# Patient Record
Sex: Female | Born: 1965
Health system: Southern US, Community
[De-identification: ages and names within clinical notes are randomized; demographics above are authoritative.]

## PROBLEM LIST (undated history)

## (undated) DIAGNOSIS — F419 Anxiety disorder, unspecified: Secondary | ICD-10-CM

## (undated) DIAGNOSIS — D649 Anemia, unspecified: Secondary | ICD-10-CM

## (undated) DIAGNOSIS — L905 Scar conditions and fibrosis of skin: Secondary | ICD-10-CM

## (undated) DIAGNOSIS — I10 Essential (primary) hypertension: Secondary | ICD-10-CM

## (undated) DIAGNOSIS — R002 Palpitations: Secondary | ICD-10-CM

## (undated) DIAGNOSIS — K219 Gastro-esophageal reflux disease without esophagitis: Secondary | ICD-10-CM

## (undated) DIAGNOSIS — K602 Anal fissure, unspecified: Secondary | ICD-10-CM

## (undated) DIAGNOSIS — R609 Edema, unspecified: Secondary | ICD-10-CM

## (undated) DIAGNOSIS — F329 Major depressive disorder, single episode, unspecified: Secondary | ICD-10-CM

## (undated) DIAGNOSIS — F32A Depression, unspecified: Secondary | ICD-10-CM

## (undated) DIAGNOSIS — Z9189 Other specified personal risk factors, not elsewhere classified: Secondary | ICD-10-CM

## (undated) DIAGNOSIS — K279 Peptic ulcer, site unspecified, unspecified as acute or chronic, without hemorrhage or perforation: Secondary | ICD-10-CM

## (undated) HISTORY — DX: Essential (primary) hypertension: I10

## (undated) HISTORY — PX: PARTIAL HYSTERECTOMY: SHX80

## (undated) HISTORY — DX: Other specified personal risk factors, not elsewhere classified: Z91.89

## (undated) HISTORY — PX: TUBAL LIGATION: SHX77

## (undated) HISTORY — DX: Palpitations: R00.2

## (undated) HISTORY — DX: Scar conditions and fibrosis of skin: L90.5

## (undated) HISTORY — DX: Anemia, unspecified: D64.9

## (undated) HISTORY — PX: KNEE ARTHROSCOPY: SUR90

## (undated) HISTORY — DX: Anal fissure, unspecified: K60.2

## (undated) HISTORY — DX: Anxiety disorder, unspecified: F41.9

## (undated) HISTORY — PX: DILATION AND CURETTAGE OF UTERUS: SHX78

## (undated) HISTORY — DX: Peptic ulcer, site unspecified, unspecified as acute or chronic, without hemorrhage or perforation: K27.9

## (undated) HISTORY — PX: OTHER SURGICAL HISTORY: SHX169

## (undated) HISTORY — DX: Edema, unspecified: R60.9

## (undated) HISTORY — DX: Gastro-esophageal reflux disease without esophagitis: K21.9

## (undated) HISTORY — DX: Depression, unspecified: F32.A

## (undated) HISTORY — PX: CHOLECYSTECTOMY: SHX55

## (undated) HISTORY — PX: HERNIA REPAIR: SHX51

---
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
## 2003-11-13 ENCOUNTER — Inpatient Hospital Stay (HOSPITAL_COMMUNITY): Admission: AD | Admit: 2003-11-13 | Discharge: 2003-11-13 | Payer: Self-pay | Admitting: *Deleted

## 2003-12-03 ENCOUNTER — Encounter: Admission: RE | Admit: 2003-12-03 | Discharge: 2003-12-03 | Payer: Self-pay | Admitting: Obstetrics and Gynecology

## 2003-12-03 ENCOUNTER — Encounter (INDEPENDENT_AMBULATORY_CARE_PROVIDER_SITE_OTHER): Payer: Self-pay | Admitting: Specialist

## 2003-12-03 ENCOUNTER — Other Ambulatory Visit: Admission: RE | Admit: 2003-12-03 | Discharge: 2003-12-03 | Payer: Self-pay | Admitting: Family Medicine

## 2003-12-17 ENCOUNTER — Encounter: Admission: RE | Admit: 2003-12-17 | Discharge: 2003-12-17 | Payer: Self-pay | Admitting: Obstetrics and Gynecology

## 2004-04-14 ENCOUNTER — Encounter: Admission: RE | Admit: 2004-04-14 | Discharge: 2004-04-14 | Payer: Self-pay | Admitting: Obstetrics and Gynecology

## 2004-04-28 ENCOUNTER — Encounter: Admission: RE | Admit: 2004-04-28 | Discharge: 2004-04-28 | Payer: Self-pay | Admitting: Obstetrics and Gynecology

## 2004-04-28 ENCOUNTER — Other Ambulatory Visit: Admission: RE | Admit: 2004-04-28 | Discharge: 2004-04-28 | Payer: Self-pay | Admitting: Family Medicine

## 2004-04-29 ENCOUNTER — Encounter (INDEPENDENT_AMBULATORY_CARE_PROVIDER_SITE_OTHER): Payer: Self-pay | Admitting: *Deleted

## 2004-05-12 ENCOUNTER — Ambulatory Visit: Payer: Self-pay | Admitting: Family Medicine

## 2004-08-11 ENCOUNTER — Ambulatory Visit: Payer: Self-pay | Admitting: Family Medicine

## 2004-08-11 ENCOUNTER — Encounter (INDEPENDENT_AMBULATORY_CARE_PROVIDER_SITE_OTHER): Payer: Self-pay | Admitting: Specialist

## 2004-08-11 ENCOUNTER — Other Ambulatory Visit: Admission: RE | Admit: 2004-08-11 | Discharge: 2004-08-11 | Payer: Self-pay | Admitting: Family Medicine

## 2004-08-25 ENCOUNTER — Ambulatory Visit: Payer: Self-pay | Admitting: Obstetrics and Gynecology

## 2004-09-28 ENCOUNTER — Ambulatory Visit: Payer: Self-pay | Admitting: Internal Medicine

## 2004-10-04 ENCOUNTER — Ambulatory Visit: Payer: Self-pay | Admitting: Internal Medicine

## 2004-10-19 ENCOUNTER — Ambulatory Visit (HOSPITAL_COMMUNITY): Admission: RE | Admit: 2004-10-19 | Discharge: 2004-10-19 | Payer: Self-pay | Admitting: Internal Medicine

## 2004-10-19 ENCOUNTER — Ambulatory Visit: Payer: Self-pay | Admitting: Internal Medicine

## 2004-12-01 ENCOUNTER — Ambulatory Visit: Payer: Self-pay | Admitting: Family Medicine

## 2005-01-06 ENCOUNTER — Ambulatory Visit: Payer: Self-pay | Admitting: Internal Medicine

## 2005-06-19 ENCOUNTER — Ambulatory Visit: Payer: Self-pay | Admitting: Internal Medicine

## 2005-09-25 IMAGING — CR DG CHEST 2V
2 series · 2 of 2 positions shown · non-contrast
Comparison: None.

CLINICAL DATA: Cough/fever.  
 CHEST - TWO VIEW:

[view not recorded (1 of 2)]
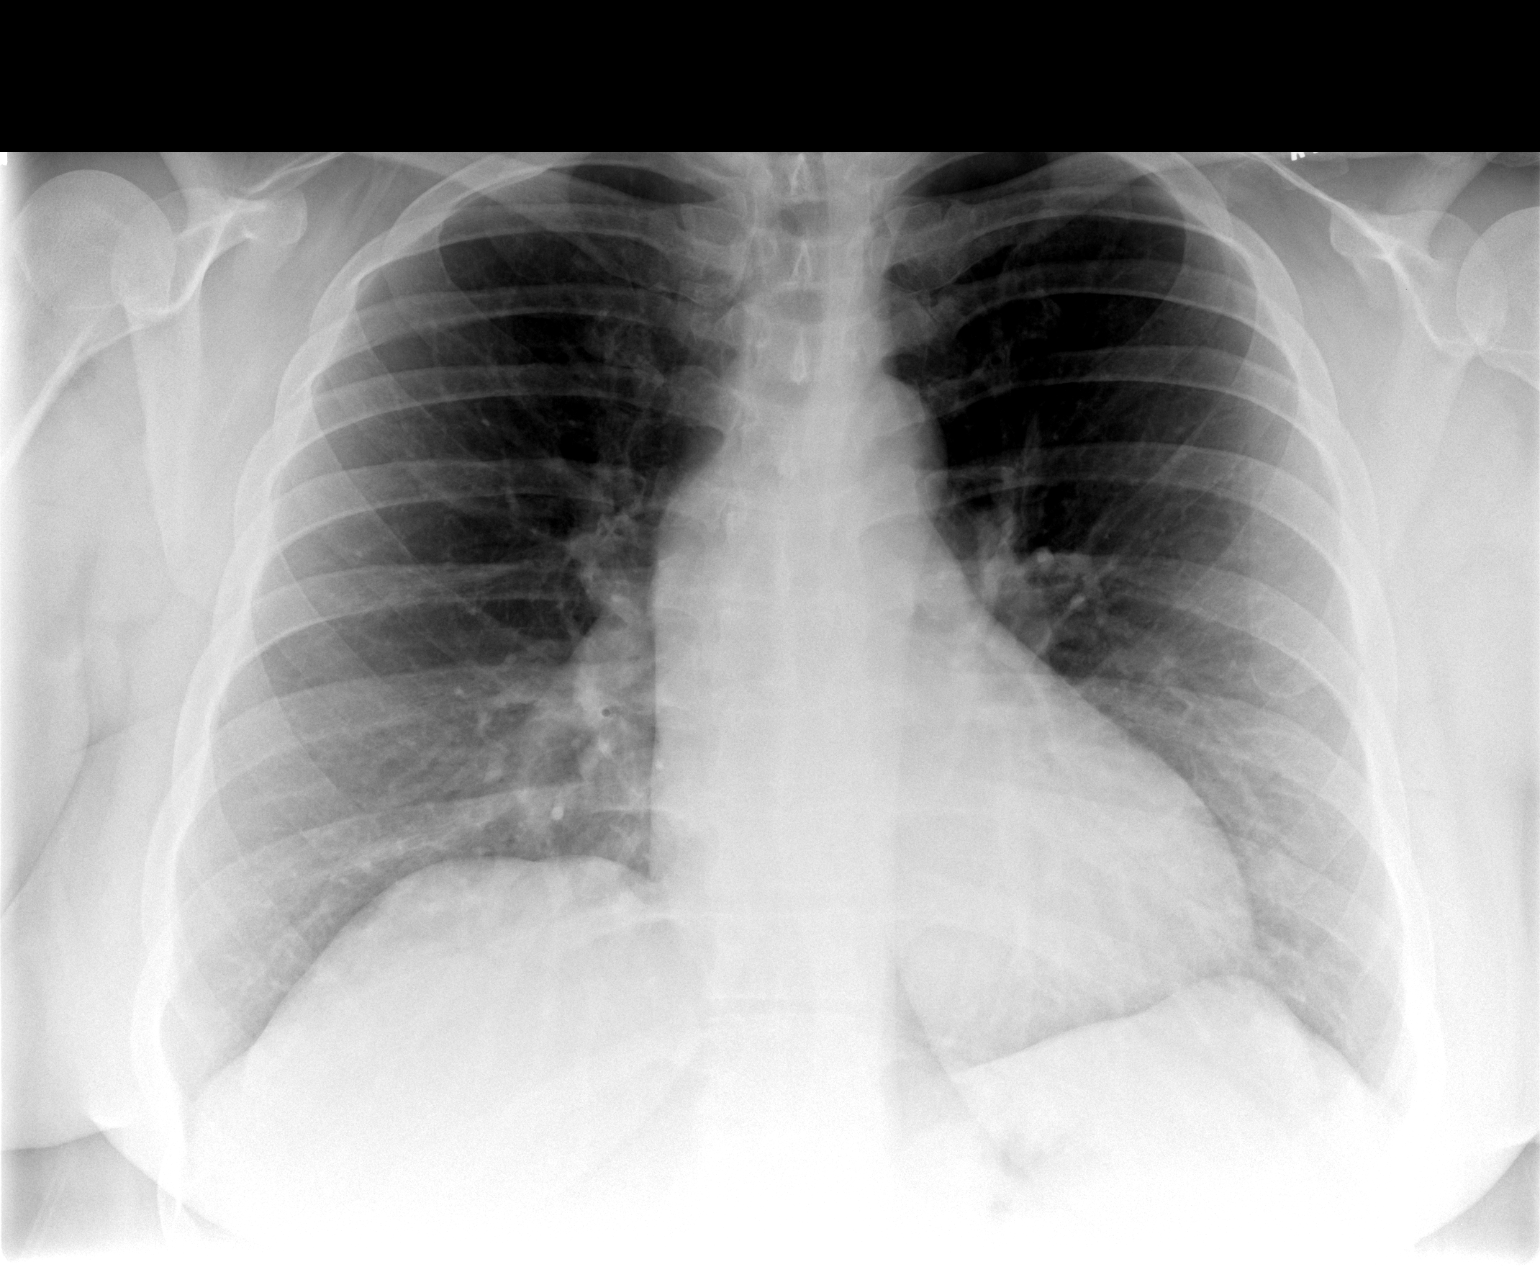

[view not recorded (2 of 2)]
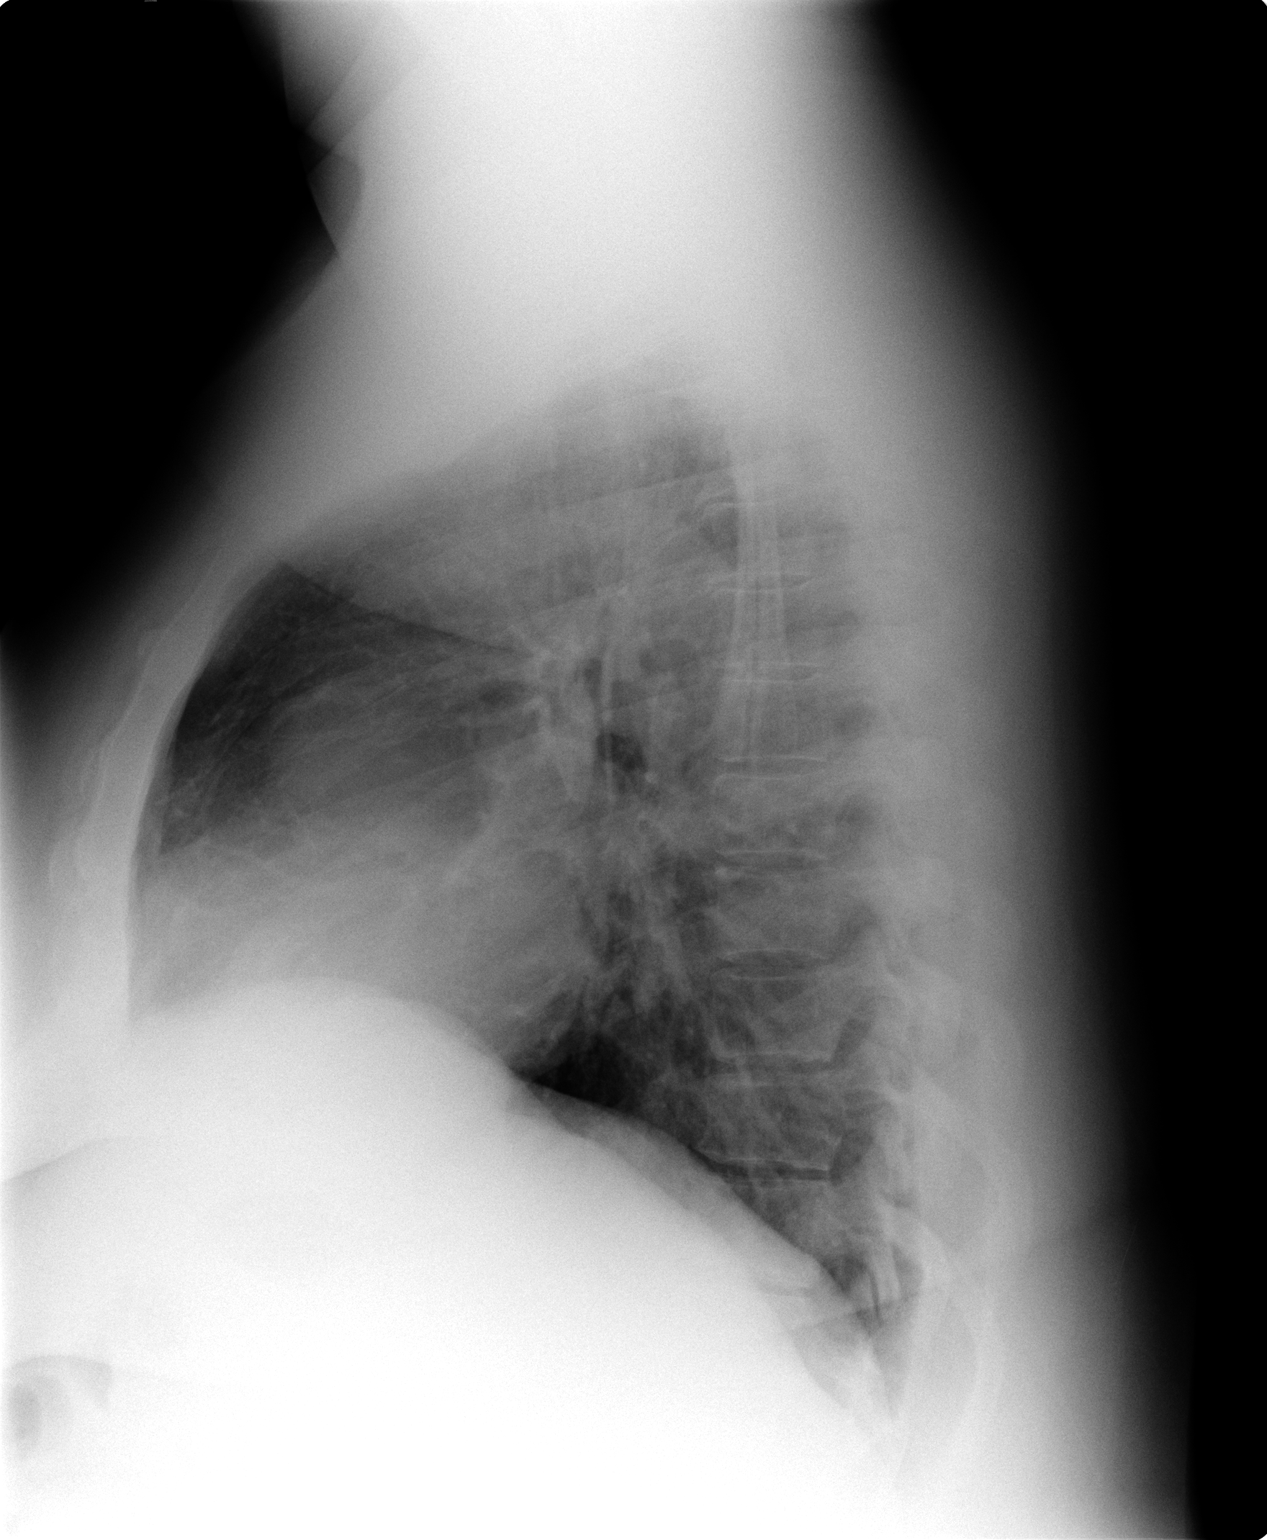

[2 of 2 positions shown; findings below may reference images not displayed]

FINDINGS: Heart mildly enlarged.  Aorta slightly tortuous.  No congestive heart failure or active disease.  Osseous structures intact.
IMPRESSION: Slight cardiomegaly - no active disease.

## 2005-12-07 ENCOUNTER — Encounter: Payer: Self-pay | Admitting: Family Medicine

## 2005-12-07 ENCOUNTER — Encounter (INDEPENDENT_AMBULATORY_CARE_PROVIDER_SITE_OTHER): Payer: Self-pay | Admitting: Internal Medicine

## 2005-12-07 ENCOUNTER — Other Ambulatory Visit: Admission: RE | Admit: 2005-12-07 | Discharge: 2005-12-07 | Payer: Self-pay | Admitting: Family Medicine

## 2005-12-07 ENCOUNTER — Ambulatory Visit: Payer: Self-pay | Admitting: Family Medicine

## 2005-12-11 ENCOUNTER — Encounter (INDEPENDENT_AMBULATORY_CARE_PROVIDER_SITE_OTHER): Payer: Self-pay | Admitting: *Deleted

## 2005-12-11 ENCOUNTER — Encounter (INDEPENDENT_AMBULATORY_CARE_PROVIDER_SITE_OTHER): Payer: Self-pay | Admitting: Dermatology

## 2005-12-14 ENCOUNTER — Encounter (INDEPENDENT_AMBULATORY_CARE_PROVIDER_SITE_OTHER): Payer: Self-pay | Admitting: Internal Medicine

## 2005-12-14 ENCOUNTER — Ambulatory Visit (HOSPITAL_COMMUNITY): Admission: RE | Admit: 2005-12-14 | Discharge: 2005-12-14 | Payer: Self-pay | Admitting: *Deleted

## 2005-12-21 ENCOUNTER — Ambulatory Visit: Payer: Self-pay | Admitting: Family Medicine

## 2006-04-27 ENCOUNTER — Ambulatory Visit: Payer: Self-pay | Admitting: Internal Medicine

## 2006-05-16 ENCOUNTER — Ambulatory Visit: Payer: Self-pay | Admitting: Internal Medicine

## 2006-06-07 ENCOUNTER — Encounter (INDEPENDENT_AMBULATORY_CARE_PROVIDER_SITE_OTHER): Payer: Self-pay | Admitting: Dermatology

## 2006-06-07 DIAGNOSIS — I10 Essential (primary) hypertension: Secondary | ICD-10-CM | POA: Insufficient documentation

## 2006-06-07 DIAGNOSIS — E669 Obesity, unspecified: Secondary | ICD-10-CM | POA: Insufficient documentation

## 2006-06-07 DIAGNOSIS — N921 Excessive and frequent menstruation with irregular cycle: Secondary | ICD-10-CM | POA: Insufficient documentation

## 2006-06-07 DIAGNOSIS — M7710 Lateral epicondylitis, unspecified elbow: Secondary | ICD-10-CM | POA: Insufficient documentation

## 2006-09-05 DIAGNOSIS — D509 Iron deficiency anemia, unspecified: Secondary | ICD-10-CM

## 2006-10-25 ENCOUNTER — Encounter (INDEPENDENT_AMBULATORY_CARE_PROVIDER_SITE_OTHER): Payer: Self-pay | Admitting: *Deleted

## 2006-10-25 ENCOUNTER — Telehealth: Payer: Self-pay | Admitting: *Deleted

## 2006-10-25 ENCOUNTER — Ambulatory Visit: Payer: Self-pay | Admitting: Internal Medicine

## 2006-10-25 DIAGNOSIS — A088 Other specified intestinal infections: Secondary | ICD-10-CM

## 2006-10-25 DIAGNOSIS — J069 Acute upper respiratory infection, unspecified: Secondary | ICD-10-CM | POA: Insufficient documentation

## 2006-10-25 LAB — CONVERTED CEMR LAB
Inflenza A Ag: NEGATIVE
Influenza B Ag: NEGATIVE
Streptococcus, Group A Screen (Direct): NEGATIVE

## 2006-10-26 LAB — CONVERTED CEMR LAB
Basophils Absolute: 0 10*3/uL (ref 0.0–0.1)
Basophils Relative: 0 % (ref 0–1)
Eosinophils Absolute: 0.1 10*3/uL (ref 0.0–0.7)
MCHC: 32.3 g/dL (ref 30.0–36.0)
MCV: 66.8 fL — ABNORMAL LOW (ref 78.0–100.0)
Monocytes Relative: 6 % (ref 3–11)
Neutro Abs: 5.8 10*3/uL (ref 1.7–7.7)
Neutrophils Relative %: 69 % (ref 43–77)
Platelets: 329 10*3/uL (ref 150–400)
RBC: 4.41 M/uL (ref 3.87–5.11)

## 2007-09-19 ENCOUNTER — Ambulatory Visit: Payer: Self-pay | Admitting: Family Medicine

## 2007-09-19 ENCOUNTER — Other Ambulatory Visit: Admission: RE | Admit: 2007-09-19 | Discharge: 2007-09-19 | Payer: Self-pay | Admitting: Family Medicine

## 2007-09-19 ENCOUNTER — Encounter: Payer: Self-pay | Admitting: Family Medicine

## 2007-10-09 ENCOUNTER — Telehealth: Payer: Self-pay | Admitting: *Deleted

## 2007-10-14 ENCOUNTER — Ambulatory Visit: Payer: Self-pay | Admitting: Internal Medicine

## 2007-10-14 ENCOUNTER — Encounter (INDEPENDENT_AMBULATORY_CARE_PROVIDER_SITE_OTHER): Payer: Self-pay | Admitting: Infectious Diseases

## 2007-10-16 LAB — CONVERTED CEMR LAB
BUN: 9 mg/dL (ref 6–23)
Chloride: 104 meq/L (ref 96–112)
Hemoglobin: 8.3 g/dL — ABNORMAL LOW (ref 12.0–15.0)
MCHC: 27.4 g/dL — ABNORMAL LOW (ref 30.0–36.0)
Platelets: 380 10*3/uL (ref 150–400)
Potassium: 4 meq/L (ref 3.5–5.3)
RDW: 21.9 % — ABNORMAL HIGH (ref 11.5–15.5)
Sodium: 140 meq/L (ref 135–145)

## 2007-10-18 ENCOUNTER — Ambulatory Visit: Payer: Self-pay | Admitting: Family Medicine

## 2007-10-30 ENCOUNTER — Ambulatory Visit: Payer: Self-pay | Admitting: Family Medicine

## 2010-09-25 ENCOUNTER — Encounter: Payer: Self-pay | Admitting: *Deleted

## 2011-01-17 NOTE — Group Therapy Note (Signed)
[name], [name] NO.:  [id number]   MEDICAL RECORD NO.:  [id number]          PATIENT TYPE:  WOC   LOCATION[facility]                   FACILITY:  [facility]   PHYSICIAN:  Tinnie Gens, MD        DATE OF BIRTH:  05-18-1966   DATE OF SERVICE:  09/19/2007                                  CLINIC NOTE   CHIEF COMPLAINT:  Heavy vaginal bleeding.   HISTORY OF PRESENT ILLNESS:  The patient is a 45 year old gravida 2,  para 2-0-0-3 who has a history of simple hyperplasia.  She has  previously had endometrial biopsy and been on progesterone. However, the  progesterone seems to make her have increased appetite and she does not  want to take that again if possible and her cycles have been regular up  until last two or three so now her heavy bleeding is again and is back  for repeat endometrial biopsy.  Her last Pap was approximately 2 years  ago as well.   PAST MEDICAL HISTORY:  Hypertension.   MEDICATIONS:  She is on lisinopril/hypothyroidism 20/25 one p.o. daily,  multivitamin daily.   ALLERGIES:  ARE TO SULFA, PSEUDOEPHEDRINE and GUAIFENESIN.   PAST SURGICAL HISTORY:  1. Cholecystectomy.  2. BTL and repair of clubbed feet.   GYN HISTORY:  Menarche at age 17.  Cycles are heavy, every two weeks.  No history of abnormal Pap. No mammogram in the last year.   FAMILY HISTORY:  Diabetes, hypertension, skin cancer.   SOCIAL HISTORY:  Married with three sons, works and has just moved to  __________.  She has health insurance now.   REVIEW OF SYSTEMS:  Fourteen point review of system review is negative  for fever, chills, nausea, vomiting, diarrhea, constipation or shortness  of breath, chest pain, abdominal pain, constipation, diarrhea or  dysuria.   PHYSICAL EXAMINATION:  Blood pressure is 136/79, weight is 328 pounds,  pulse 83, temperature 99.1, respiratory rate is 20. She is an obese  female in no acute distress.  ABDOMEN:  Soft, nontender, nondistended.  GU:   Normal external female genitalia.  BUS is normal.  Vagina is pink  and rugated.  Cervix is parous without lesion.  Uterus and adnexa cannot  be appropriately felt secondary to body habitus.   PROCEDURE:  Cervix is identified, grasped anteriorly with single toothed  tenaculum and sounded to approximately 10 cm; endometrial sampling is  taken; the Pipelle was passed x3.  The patient tolerated procedure well.   IMPRESSION:  1. GYN exam with Pap.  2. Heavy vaginal bleeding with a history of simple hyperplasia.  3. Obesity.   PLAN:  1. Pap smear today.  2. Schedule mammogram at her convenience.  3. Endometrial sampling.  Return in week for results.           ______________________________  Tinnie Gens, MD     TP/MEDQ  D:  09/19/2007  T:  09/19/2007  Job:  [id number]

## 2011-01-17 NOTE — Assessment & Plan Note (Signed)
NAMEARLINA, Sherry Middleton NO.:  [id number]   MEDICAL RECORD NO.:  [id number]          PATIENT TYPE:  WOC   LOCATION:  [facility]         FACILITY:  [facility]   PHYSICIAN:  Tinnie Gens, MD        DATE OF BIRTH:  Feb 23, 1966   DATE OF SERVICE:                                  CLINIC NOTE   CHIEF COMPLAINT:  Follow-up bleeding.   HISTORY OF PRESENT ILLNESS:  The patient is a 45 year old gravida 2,  para 2 who has a history of simple endometrial hyperplasia secondary to  abnormal bleeding and had endometrial sampling done back in January.  Since that time, she has had heavy bleeding continued.  Her biopsy  reveals again simple hyperplasia without evidence of atypia.  Again  discussion was had about oral progestin versus IUD.  The patient  declines IUD at this time.  We will try cyclical oral progestin 10 days  a month and repeat endometrial sampling in 3 months.           ______________________________  Tinnie Gens, MD     TP/MEDQ  D:  10/18/2007  T:  10/20/2007  Job:  [id number]

## 2011-01-20 NOTE — Group Therapy Note (Signed)
Sherry Middleton, ROYSE NO.:  [id number]   MEDICAL RECORD NO.:  [id number]          PATIENT TYPE:  WOC   [facility]ATION[facility]                   FACILITY:  [facility]   PHYSICIAN:  Tinnie Gens, MD        DATE OF BIRTH:  26-Oct-1965   DATE OF SERVICE:  12/21/2005                                    CLINIC NOTE   CHIEF COMPLAINT:  Polyp results.   HISTORY OF PRESENT ILLNESS:  The patient is a 45 year old patient who has  history of simple hyperplasia who has heavy vaginal bleeding over the last  year exam and had endometrial biopsy at that time.  All her labs are  reviewed today.  She has a normal mammogram, normal Pap smear and a normal  endometrial biopsy.  She reports that her bleeding, although more frequent,  is not any heavier than it had previously been with a negative biopsy.  Will  defer treatment at this time.  She will follow up in six months to see how  she is doing.           ______________________________  Tinnie Gens, MD     TP/MEDQ  D:  12/21/2005  T:  12/22/2005  Job:  [id number]

## 2011-01-20 NOTE — Group Therapy Note (Signed)
Sherry Middleton, RUNKEL NO.:  [id number]   MEDICAL RECORD NO.:  [id number]          PATIENT TYPE:  WOC   [facility]ATION[facility]                   FACILITY:  [facility]   PHYSICIAN:  Tinnie Gens, MD        DATE OF BIRTH:  11-22-1965   DATE OF SERVICE:  08/11/2004                                    CLINIC NOTE   CHIEF COMPLAINT:  Follow up endometrial hyperplasia.   HISTORY OF PRESENT ILLNESS:  The patient is a 45 year old gravida 2 para 2-0-  0-3 who has a history of simple hyperplasia who has been on Provera for the  last 6 months.  She has undergone endometrial biopsy once and her  hyperplasia got on to simple.  Her last two periods have been very light  which is unusual for her, and we are hoping that her endometrial hyperplasia  has gone away completely.  The patient has gained about 15 pounds since she  has been on progesterone and states that she is always very hungry.   PHYSICAL EXAMINATION TODAY:  GENERAL:  She is an obese female in no acute  distress.  GENITOURINARY:  She has some raised, red areas on her labia that are  consistent with chronic yeast.  She had normal external female genitalia  other than that.  The vagina is rugated and pink.  The cervix is visualized  without lesions.   PROCEDURE:  Cleaned with Betadine x3 and Pipelle is used which sounds to  approximately 12 cm and an endometrial biopsy is obtained.  The patient has  tolerated the procedure well.  The tenaculum was removed and the speculum  removed from the vagina.   IMPRESSION:  History of endometrial hyperplasia.   PLAN:  The patient will continue Provera for the next 2 weeks until results  come back from this biopsy.  If this biopsy is normal, she can go on  intermittent Provera every 4-6 weeks and does not have to be on it  continuously.  If it continues to be positive, will discuss other options  for her.      TP/MEDQ  D:  08/11/2004  T:  08/12/2004  Job:  [id number]

## 2011-01-20 NOTE — Group Therapy Note (Signed)
NAMEFAREEHA, EVON NO.:  [id number]   MEDICAL RECORD NO.:  [id number]                   PATIENT TYPE:  WOC   LOCATION[facility]  [facility]                           FACILITY:  [facility]   PHYSICIAN:  Tinnie Gens, MD                     DATE OF BIRTH:  04-16-1966   DATE OF SERVICE:  05/12/2004                                    CLINIC NOTE   CHIEF COMPLAINT:  Follow-up endometrial biopsy for endometrial hyperplasia.   SUBJECTIVE:  Ms. Westergard is here today for follow-up of her results.  The good news is that her biopsy now shows only focal simple hyperplasia  without atypia.  Her initial biopsy showed focal simple and complex  hyperplasia without atypia.  She has been on Provera for this month.  She  has just started back on it at her last visit with Dr. Shawnie Pons at the end of  August.  She has had some spotting on the Provera but otherwise has not  bled.  She is having no pelvic pain.   OBJECTIVE:  VITAL SIGNS:  Noted.  GENERAL:  She is an overweight female in no acute distress.   ASSESSMENT AND PLAN:  1.  Endometrial hyperplasia.  She will continue with her Provera for the      next 3 months.  At that time she will see Dr. Shawnie Pons for repeat biopsy.      We did discuss continuing Provera versus oral contraceptives if her      blood pressure can be controlled.  2.  Hypertension.  Her blood pressure is elevated today.  She is on      lisinopril and hydrochlorothiazide and is being followed by the [facility]      outpatient internal medicine clinic.   She is to follow up in December.     Jon Gills, MD                       Tinnie Gens, MD    LC/MEDQ  D:  05/12/2004  T:  05/13/2004  Job:  [id number]

## 2011-01-20 NOTE — Group Therapy Note (Signed)
Sherry Middleton, JEFCOAT NO.:  [id number]   MEDICAL RECORD NO.:  [id number]          PATIENT TYPE:  WOC   [facility]ATION[facility]                   FACILITY:  [facility]   PHYSICIAN:  Tinnie Gens, MD        DATE OF BIRTH:  07/10/1966   DATE OF SERVICE:                                    CLINIC NOTE   CHIEF COMPLAINT:  Yearly exam.   HISTORY OF PRESENT ILLNESS:  The patient is a 45 year old gravida 2, para 2-  0-0-3 who has not been seen here for over a year.  She was previously seen  for cervical hyperplasia without atypia and she was cycled on Provera.  Her  last endometrial biopsy was negative in December 2005.  She has not been  using Provera since then but does have some irregular bleeding.  No other  complaints GYN-related.  The patient denies passage of clots.   PAST MEDICAL HISTORY:  Significant for hypertension.   MEDICATIONS:  Zestoretic.   ALLERGIES:  SULFA AND PSEUDOEPHEDRINE.   PAST SURGICAL HISTORY:  1.  Cholecystectomy.  2.  Tubal ligation.  3.  Repair of clubfeet.   GYN HISTORY:  Menarche at age 36.  Cycles are heavy.  They come every two  weeks.  No history of abnormal Paps.  She has not had a mammogram yet.   FAMILY HISTORY:  Diabetes, hypertension and skin cancer.   SOCIAL HISTORY:  She is married.  She has three sons.  She works.  She has a  new addition to her family in the form of a new grandson.   REVIEW OF SYSTEMS:  A 14-point review of systems was reviewed and is  negative for fevers, chills, nausea, vomiting, diarrhea, constipation,  shortness of breath, chest pain.   EXAM:  Her VITALS are as noted in the chart.  She is afebrile at 98.6, blood  pressure is 150/77, weight 323.4.  She is a morbidly obese white female in  no acute distress.  ABDOMEN:  Soft, nontender, nondistended.  BREASTS:  Symmetric with everted nipples.  She has diffuse bilateral  fibrocystic changes.  No supraclavicular or axillary adenopathy.  GU:  Normal external  female genitalia.  Vagina is pink and rugae of the  cervix is parous and without lesions.   PROCEDURE:  The cervix was cleaned with Betadine x 3.  It sounded to  approximately 10 cm.  Anterior lip was grasped with a single-tooth  tenaculum.  An endometrial biopsy sampling is obtained.  On bimanual, the  uterus cannot really be palpated nor can the adnexa due to body habitus.   IMPRESSION:  1.  Abnormal uterine bleeding.  2.  Yearly exam with Pap smear.   PLAN:  1.  Pap smear today.  2.  Schedule mammogram.  3.  Endometrial biopsy sampling.  Will follow up results in two weeks from      the endometrial sampling and consider a Mirena IUD placement.  The      patient is very reluctant to go back on Provera.           ______________________________  Tinnie Gens, MD  TP/MEDQ  D:  12/07/2005  T:  12/08/2005  Job:  [id number]

## 2011-01-20 NOTE — Group Therapy Note (Signed)
NAMEARTI, TRANG NO.:  [id number]   MEDICAL RECORD NO.:  [id number]          PATIENT TYPE:  WOC   [facility]ATION[facility]                   FACILITY:  [facility]   PHYSICIAN:  Tinnie Gens, MD        DATE OF BIRTH:  Apr 26, 1966   DATE OF SERVICE:                                    CLINIC NOTE   CHIEF COMPLAINT:  Three month followup.   HISTORY OF PRESENT ILLNESS:  The patient is a 45 year old para 2, 0-0-3, who  has history of simple hyperplasia in the past.  She also has history of  complex hyperplasia, who's last endometrial biopsy in December of last year  was normal.  She has been advised not to do any Provera if she does not have  a period.  Her periods have been regular and her bleeding is less, but still  sort of heavy.  She has lost 10 pounds since her last visit.   On examination, she is an obese female in no acute distress.  Abdomen soft,  nontender, nondistended.   IMPRESSION:  1.  Endometrial hyperplasia.  2.  Menorrhagia.   PLAN:  1.  The patient will continue her monitor her periods and see how they go.      If she continues to have heavy bleeding at her next visit, will repeat      her endometrial biopsy.  2.  Return in six months for Pap.  Mammogram scheduled.      TP/MEDQ  D:  12/01/2004  T:  12/01/2004  Job:  [id number]

## 2011-01-20 NOTE — Group Therapy Note (Signed)
Sherry Middleton, DARGIS NO.:  [id number]   MEDICAL RECORD NO.:  [id number]                   PATIENT TYPE:  OUT   LOCATION[facility]                           FACILITY:  [facility]   PHYSICIAN:  Tinnie Gens, MD                     DATE OF BIRTH:  1965-10-05   DATE OF SERVICE:  04/28/2004                                    CLINIC NOTE   CHIEF COMPLAINT:  Follow up hyperplasia.   HISTORY OF PRESENT ILLNESS:  The patient is a 45 year old gravida 2 para 2-0-  0-3 who had complex and simple hyperplasia on an endometrial biopsy in  April.  She has been on Provera  for the last three-and-a-half months until  she ran out.  She has been off it for approximately 2 weeks.  She continues  to have heavy periods that last at least 10 days.  She is here for a repeat  biopsy today.   PHYSICAL EXAMINATION TODAY:  VITAL SIGNS:  Her blood pressure is 192/89;  recheck of this is 120/62.  Weight is 324.3.  GENERAL:  She is an obese white female in no acute distress.  PELVIC:  External genitalia show probable chronic candidiasis.  The vagina  is rugated, cervix is without lesion.   PROCEDURE:  A single tooth tenaculum used to grasp the anterior lip of the  cervix.  It was cleaned with Betadine x3 and a Pipelle is used to sound the  uterus, which sounds to 11 cm.  Biopsy is obtained without difficulty.  The  single tooth tenaculum was removed from the cervix.   IMPRESSION:  1. Complex and simple hyperplasia without atypia.  Continue Provera.  Follow-     up will be in 3 months.  2. Hypertension.  Discussed with her her poorly-controlled blood pressure.     She reports she has taken her medicine but is about to run out of her     prescriptions.  She would like me to refill these.  Also, she reports     that she does not tolerate calcium channel blockers or beta blockers and     she is already on a combination of ACE and diuretic.   PLAN:  Have tried to refer her to the  outpatient clinic at [facility] for  blood pressure control.  She will be called when an appointment is made.                                               Tinnie Gens, MD    TP/MEDQ  D:  04/28/2004  T:  04/29/2004  Job:  [id number]
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
## 2012-06-18 DIAGNOSIS — Z599 Problem related to housing and economic circumstances, unspecified: Secondary | ICD-10-CM | POA: Insufficient documentation

## 2013-02-08 DIAGNOSIS — F411 Generalized anxiety disorder: Secondary | ICD-10-CM | POA: Insufficient documentation

## 2013-02-08 DIAGNOSIS — F339 Major depressive disorder, recurrent, unspecified: Secondary | ICD-10-CM | POA: Insufficient documentation

## 2013-12-18 ENCOUNTER — Ambulatory Visit (INDEPENDENT_AMBULATORY_CARE_PROVIDER_SITE_OTHER): Payer: BC Managed Care – PPO

## 2013-12-18 VITALS — BP 118/69 | HR 74 | Resp 18

## 2013-12-18 DIAGNOSIS — M1990 Unspecified osteoarthritis, unspecified site: Secondary | ICD-10-CM

## 2013-12-18 DIAGNOSIS — M7750 Other enthesopathy of unspecified foot: Secondary | ICD-10-CM

## 2013-12-18 DIAGNOSIS — R52 Pain, unspecified: Secondary | ICD-10-CM

## 2013-12-18 DIAGNOSIS — Q6689 Other  specified congenital deformities of feet: Secondary | ICD-10-CM

## 2013-12-18 DIAGNOSIS — Z8776 Personal history of (corrected) congenital malformations of integument, limbs and musculoskeletal system: Secondary | ICD-10-CM

## 2013-12-18 DIAGNOSIS — Z87768 Personal history of other specified (corrected) congenital malformations of integument, limbs and musculoskeletal system: Secondary | ICD-10-CM

## 2013-12-18 NOTE — Progress Notes (Signed)
   Subjective:    Patient ID: Sherry Middleton, female    DOB: September 25, 1965, 48 y.o.   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      Objective:   Physical Exam 48 year old white female well-developed under short-term history patient is considerably overweight has a history of residual clubfoot deformity and abnormality gait. Lotion objective findings as follows vascular status is intact with pedal pulses palpable DP postal for PT plus one over 4 there is mild + edema noted both lower extremities patient does have severe pedis valgus deformity with abduction of the forefoot midfoot the digits and forefoot of the road relatively rectus however has significant collapse the medial column the arch x-rays depict osteo- arthritic changes and should clubfoot-type changes patient did have Achilles tendon lengthenings with calcification calcifications in the Achilles tendon are noted patient's also soft tissue releases done in the past there is left foot more severe than right with talonavicular spurring and beaking asymmetric joint space tearing of the subtalar mid tarsus joints and possibly Lisfranc joints. The forefoot and digits are relatively rectus and unremarkable       Assessment & Plan:  Assessment this time patient is pain arthritis and capsulitis of the rear foot mid foot subtalar joint and mid tarsus joints secondary to clubfoot deformity and osteoarthropathy to patient's would benefit from orthotics or even an AFO device and shoes are this time we'll try to get the extra-depth shoes covered by her health care savings card and insurance may be covering her for  functional orthoses with some accommodative top cover in which may be beneficial. Last resorts of orthoses and shoes they'll be option of surgical fusion of her arthritic and deformed feet and toes. Reappointed to be for the next week for possible orthotic casting for functional orthoses as well as measurements for extra-depth shoes.  Harriet Masson DPM

## 2013-12-18 NOTE — Patient Instructions (Signed)

## 2013-12-24 ENCOUNTER — Other Ambulatory Visit: Payer: BC Managed Care – PPO

## 2013-12-31 ENCOUNTER — Ambulatory Visit (INDEPENDENT_AMBULATORY_CARE_PROVIDER_SITE_OTHER): Payer: BC Managed Care – PPO | Admitting: *Deleted

## 2013-12-31 ENCOUNTER — Other Ambulatory Visit: Payer: BC Managed Care – PPO

## 2013-12-31 DIAGNOSIS — Q6689 Other  specified congenital deformities of feet: Secondary | ICD-10-CM

## 2013-12-31 DIAGNOSIS — M722 Plantar fascial fibromatosis: Secondary | ICD-10-CM

## 2013-12-31 NOTE — Progress Notes (Signed)
° °  Subjective:    Patient ID: Sherry Middleton, female    DOB: 1966-01-11, 48 y.o.   MRN: [id number]  HPI I am here to get casted    Review of Systems     Objective:   Physical Exam        Assessment & Plan:

## 2014-02-05 ENCOUNTER — Ambulatory Visit (INDEPENDENT_AMBULATORY_CARE_PROVIDER_SITE_OTHER): Payer: BC Managed Care – PPO

## 2014-02-05 VITALS — BP 120/68 | HR 85 | Resp 18

## 2014-02-05 DIAGNOSIS — M1990 Unspecified osteoarthritis, unspecified site: Secondary | ICD-10-CM

## 2014-02-05 DIAGNOSIS — R52 Pain, unspecified: Secondary | ICD-10-CM

## 2014-02-05 DIAGNOSIS — Z87768 Personal history of other specified (corrected) congenital malformations of integument, limbs and musculoskeletal system: Secondary | ICD-10-CM

## 2014-02-05 DIAGNOSIS — M7750 Other enthesopathy of unspecified foot: Secondary | ICD-10-CM

## 2014-02-05 DIAGNOSIS — Z8776 Personal history of (corrected) congenital malformations of integument, limbs and musculoskeletal system: Secondary | ICD-10-CM

## 2014-02-05 NOTE — Patient Instructions (Signed)

## 2014-02-05 NOTE — Progress Notes (Signed)
   Subjective:    Patient ID: Sherry Middleton, female    DOB: 11-Sep-1965, 48 y.o.   MRN: [id number]  HPI I AM  HERE TO GET MY INSERTS AND MY FEET ARE BETTER THAN THEY WERE THE LAST TIME I WAS IN    Review of Systems no new changes or findings or systemic findings      Objective:   Physical Exam Neurovascular status is intact pedal pulses palpable patient does have retained clubfoot deformity and abnormality gait orthotics are dispensed with break in were instructions that fit well patient does have capsulitis a Lisfranc's and mid tarsus and rear foot secondary to deformities and residual clubfoot deformity. Orthotics are dispensed with break in were instructions a fit and contour well to interpret the tape maryjane shoes still continue recommend a good walking or athletic or Oxford type shoe and Oxford shoes recommended and prescribed at this time patient indicates that she has a bending hard medical benefits card that would cover her for of the supplies are medical supply such as a shoe that would be recommended at this time I am prescribing and ordering a lace up or Velcro strap Oxford type athletic or walking shoe to accommodate her functional orthoses and assist with gait and ambulation and prevent further breakdown arthrosis of the foot. Patient will order shoes measurements are taken at this time and will likely give your new balance or Rolena Infante or a tracks type athletic or walking shoe       Assessment & Plan:  Assessment gait abnormality with capsulitis Lisfranc's and mid tarsus joint secondary to residual clubfoot deformity in arthropathy and rear foot 9 foot plan at this time orthotics are dispensed make arrangements for possible extra-depth shoes to accommodate her orthotics other than that for appropriate shoes wearing followup in a month or 2 for ptotic adjustments if needed next  Harriet Masson DPM

## 2014-10-08 ENCOUNTER — Ambulatory Visit (INDEPENDENT_AMBULATORY_CARE_PROVIDER_SITE_OTHER): Payer: BLUE CROSS/BLUE SHIELD

## 2014-10-08 VITALS — BP 142/86 | HR 86 | Resp 12

## 2014-10-08 DIAGNOSIS — M158 Other polyosteoarthritis: Secondary | ICD-10-CM

## 2014-10-08 DIAGNOSIS — Z9889 Other specified postprocedural states: Secondary | ICD-10-CM

## 2014-10-08 DIAGNOSIS — Z8776 Personal history of (corrected) congenital malformations of integument, limbs and musculoskeletal system: Secondary | ICD-10-CM

## 2014-10-08 DIAGNOSIS — M7750 Other enthesopathy of unspecified foot: Secondary | ICD-10-CM

## 2014-10-08 DIAGNOSIS — Z87768 Personal history of other specified (corrected) congenital malformations of integument, limbs and musculoskeletal system: Secondary | ICD-10-CM

## 2014-10-08 NOTE — Patient Instructions (Signed)

## 2014-10-08 NOTE — Progress Notes (Signed)
   Subjective:    Patient ID: Galen Manila, female    DOB: 02-Mar-1966, 49 y.o.   MRN: [id number]  HPI  PUD AND GIVEN INSTRUCTION  Review of Systems no new findings or systemic changes noted    Objective:   Physical Exam  Neurovascular status intact patient does have capsulitis Lisfranc joint secondary to residual clubfoot deformity in arthropathy affecting the rear foot and forefoot. Patient is having orthotics dispensed previously at this time extra-depth shoes are dispensed to accommodate orthoses more appropriately a shoe with a stretch top cover the shoe fit and contour as well works with orthoses. The shoe may or may not be covered by her insurance we'll well-balanced fill her based on insurance coverage or noncoverage patient will follow-up in the future and as-needed basis for orthoses or shoe fitting if needed      Assessment & Plan:  Assessment capsulitis and osteoarthropathy affecting multiple joints due to residual clubfoot deformity and osteoarthropathy. Plan at this time extra-depth shoes are dispensed a fit and contour well work well with a new functional orthoses which had previously been dispensed follow-up in the future on an as-needed basis for palliative care if needed or for future orthopedic shoe needs. Next  Harriet Masson DPM

## 2014-11-19 ENCOUNTER — Ambulatory Visit: Payer: BLUE CROSS/BLUE SHIELD

## 2014-11-26 ENCOUNTER — Ambulatory Visit: Payer: BLUE CROSS/BLUE SHIELD

## 2014-12-29 DIAGNOSIS — M7541 Impingement syndrome of right shoulder: Secondary | ICD-10-CM | POA: Insufficient documentation

## 2015-03-30 DIAGNOSIS — D219 Benign neoplasm of connective and other soft tissue, unspecified: Secondary | ICD-10-CM | POA: Insufficient documentation

## 2015-09-01 DIAGNOSIS — Z9889 Other specified postprocedural states: Secondary | ICD-10-CM | POA: Insufficient documentation

## 2015-09-01 DIAGNOSIS — J45909 Unspecified asthma, uncomplicated: Secondary | ICD-10-CM | POA: Insufficient documentation

## 2015-09-01 DIAGNOSIS — Z8719 Personal history of other diseases of the digestive system: Secondary | ICD-10-CM | POA: Insufficient documentation

## 2016-04-13 DIAGNOSIS — I10 Essential (primary) hypertension: Secondary | ICD-10-CM | POA: Insufficient documentation

## 2016-04-13 DIAGNOSIS — R011 Cardiac murmur, unspecified: Secondary | ICD-10-CM | POA: Insufficient documentation

## 2017-07-09 DIAGNOSIS — M1711 Unilateral primary osteoarthritis, right knee: Secondary | ICD-10-CM | POA: Diagnosis not present

## 2017-07-09 DIAGNOSIS — I10 Essential (primary) hypertension: Secondary | ICD-10-CM | POA: Diagnosis not present

## 2017-07-09 DIAGNOSIS — Z01818 Encounter for other preprocedural examination: Secondary | ICD-10-CM | POA: Diagnosis not present

## 2017-07-11 DIAGNOSIS — S83282A Other tear of lateral meniscus, current injury, left knee, initial encounter: Secondary | ICD-10-CM | POA: Diagnosis not present

## 2017-07-11 DIAGNOSIS — G8918 Other acute postprocedural pain: Secondary | ICD-10-CM | POA: Diagnosis not present

## 2017-07-11 DIAGNOSIS — S83242A Other tear of medial meniscus, current injury, left knee, initial encounter: Secondary | ICD-10-CM | POA: Diagnosis not present

## 2017-07-11 DIAGNOSIS — M1712 Unilateral primary osteoarthritis, left knee: Secondary | ICD-10-CM | POA: Diagnosis not present

## 2017-07-12 DIAGNOSIS — I10 Essential (primary) hypertension: Secondary | ICD-10-CM | POA: Diagnosis not present

## 2017-07-12 DIAGNOSIS — D500 Iron deficiency anemia secondary to blood loss (chronic): Secondary | ICD-10-CM | POA: Diagnosis not present

## 2017-07-12 DIAGNOSIS — Z79899 Other long term (current) drug therapy: Secondary | ICD-10-CM | POA: Diagnosis not present

## 2017-07-12 DIAGNOSIS — G4733 Obstructive sleep apnea (adult) (pediatric): Secondary | ICD-10-CM | POA: Diagnosis not present

## 2017-07-12 DIAGNOSIS — M1712 Unilateral primary osteoarthritis, left knee: Secondary | ICD-10-CM | POA: Diagnosis not present

## 2017-07-12 DIAGNOSIS — M659 Synovitis and tenosynovitis, unspecified: Secondary | ICD-10-CM | POA: Diagnosis not present

## 2017-07-12 DIAGNOSIS — E559 Vitamin D deficiency, unspecified: Secondary | ICD-10-CM | POA: Diagnosis not present

## 2017-07-12 DIAGNOSIS — S83242A Other tear of medial meniscus, current injury, left knee, initial encounter: Secondary | ICD-10-CM | POA: Diagnosis not present

## 2017-07-12 DIAGNOSIS — M94262 Chondromalacia, left knee: Secondary | ICD-10-CM | POA: Diagnosis not present

## 2017-07-12 DIAGNOSIS — M2242 Chondromalacia patellae, left knee: Secondary | ICD-10-CM | POA: Diagnosis not present

## 2017-07-12 DIAGNOSIS — K219 Gastro-esophageal reflux disease without esophagitis: Secondary | ICD-10-CM | POA: Diagnosis not present

## 2017-07-12 DIAGNOSIS — E782 Mixed hyperlipidemia: Secondary | ICD-10-CM | POA: Diagnosis not present

## 2017-07-12 DIAGNOSIS — Z6841 Body Mass Index (BMI) 40.0 and over, adult: Secondary | ICD-10-CM | POA: Diagnosis not present

## 2017-07-12 DIAGNOSIS — S83272A Complex tear of lateral meniscus, current injury, left knee, initial encounter: Secondary | ICD-10-CM | POA: Diagnosis not present

## 2017-07-12 DIAGNOSIS — J302 Other seasonal allergic rhinitis: Secondary | ICD-10-CM | POA: Diagnosis not present

## 2017-07-12 DIAGNOSIS — S83282A Other tear of lateral meniscus, current injury, left knee, initial encounter: Secondary | ICD-10-CM | POA: Diagnosis not present

## 2017-07-16 DIAGNOSIS — Z4789 Encounter for other orthopedic aftercare: Secondary | ICD-10-CM | POA: Diagnosis not present

## 2017-07-16 DIAGNOSIS — R262 Difficulty in walking, not elsewhere classified: Secondary | ICD-10-CM | POA: Diagnosis not present

## 2017-07-16 DIAGNOSIS — M25562 Pain in left knee: Secondary | ICD-10-CM | POA: Diagnosis not present

## 2017-08-06 DIAGNOSIS — M1712 Unilateral primary osteoarthritis, left knee: Secondary | ICD-10-CM | POA: Diagnosis not present

## 2017-09-13 DIAGNOSIS — Z0000 Encounter for general adult medical examination without abnormal findings: Secondary | ICD-10-CM | POA: Diagnosis not present

## 2017-09-13 DIAGNOSIS — E559 Vitamin D deficiency, unspecified: Secondary | ICD-10-CM | POA: Diagnosis not present

## 2017-09-13 DIAGNOSIS — Z1231 Encounter for screening mammogram for malignant neoplasm of breast: Secondary | ICD-10-CM | POA: Diagnosis not present

## 2017-09-13 DIAGNOSIS — Z1211 Encounter for screening for malignant neoplasm of colon: Secondary | ICD-10-CM | POA: Diagnosis not present

## 2017-09-13 DIAGNOSIS — Z1331 Encounter for screening for depression: Secondary | ICD-10-CM | POA: Diagnosis not present

## 2017-09-17 DIAGNOSIS — E559 Vitamin D deficiency, unspecified: Secondary | ICD-10-CM | POA: Diagnosis not present

## 2017-09-17 DIAGNOSIS — E785 Hyperlipidemia, unspecified: Secondary | ICD-10-CM | POA: Diagnosis not present

## 2017-09-18 DIAGNOSIS — R011 Cardiac murmur, unspecified: Secondary | ICD-10-CM | POA: Diagnosis not present

## 2017-09-18 DIAGNOSIS — I10 Essential (primary) hypertension: Secondary | ICD-10-CM | POA: Diagnosis not present

## 2017-09-18 DIAGNOSIS — R9431 Abnormal electrocardiogram [ECG] [EKG]: Secondary | ICD-10-CM | POA: Diagnosis not present

## 2017-09-26 DIAGNOSIS — Z1231 Encounter for screening mammogram for malignant neoplasm of breast: Secondary | ICD-10-CM | POA: Diagnosis not present

## 2017-10-02 DIAGNOSIS — D3132 Benign neoplasm of left choroid: Secondary | ICD-10-CM | POA: Diagnosis not present

## 2017-10-18 DIAGNOSIS — R9431 Abnormal electrocardiogram [ECG] [EKG]: Secondary | ICD-10-CM | POA: Diagnosis not present

## 2017-10-18 DIAGNOSIS — I10 Essential (primary) hypertension: Secondary | ICD-10-CM | POA: Diagnosis not present

## 2017-10-31 DIAGNOSIS — M6281 Muscle weakness (generalized): Secondary | ICD-10-CM | POA: Diagnosis not present

## 2017-10-31 DIAGNOSIS — M25662 Stiffness of left knee, not elsewhere classified: Secondary | ICD-10-CM | POA: Diagnosis not present

## 2017-11-27 DIAGNOSIS — F331 Major depressive disorder, recurrent, moderate: Secondary | ICD-10-CM | POA: Diagnosis not present

## 2017-11-27 DIAGNOSIS — F411 Generalized anxiety disorder: Secondary | ICD-10-CM | POA: Diagnosis not present

## 2017-12-20 DIAGNOSIS — Z1211 Encounter for screening for malignant neoplasm of colon: Secondary | ICD-10-CM | POA: Diagnosis not present

## 2017-12-20 DIAGNOSIS — R143 Flatulence: Secondary | ICD-10-CM | POA: Diagnosis not present

## 2017-12-20 DIAGNOSIS — K5900 Constipation, unspecified: Secondary | ICD-10-CM | POA: Diagnosis not present

## 2017-12-20 DIAGNOSIS — R1013 Epigastric pain: Secondary | ICD-10-CM | POA: Diagnosis not present

## 2017-12-31 DIAGNOSIS — Z79899 Other long term (current) drug therapy: Secondary | ICD-10-CM | POA: Diagnosis not present

## 2017-12-31 DIAGNOSIS — I10 Essential (primary) hypertension: Secondary | ICD-10-CM | POA: Diagnosis not present

## 2017-12-31 DIAGNOSIS — Z6841 Body Mass Index (BMI) 40.0 and over, adult: Secondary | ICD-10-CM | POA: Diagnosis not present

## 2018-01-22 DIAGNOSIS — E559 Vitamin D deficiency, unspecified: Secondary | ICD-10-CM | POA: Diagnosis not present

## 2018-01-22 DIAGNOSIS — E782 Mixed hyperlipidemia: Secondary | ICD-10-CM | POA: Diagnosis not present

## 2018-01-22 DIAGNOSIS — Z79899 Other long term (current) drug therapy: Secondary | ICD-10-CM | POA: Diagnosis not present

## 2018-01-22 DIAGNOSIS — I10 Essential (primary) hypertension: Secondary | ICD-10-CM | POA: Diagnosis not present

## 2018-02-10 DIAGNOSIS — R197 Diarrhea, unspecified: Secondary | ICD-10-CM | POA: Diagnosis not present

## 2018-02-10 DIAGNOSIS — R161 Splenomegaly, not elsewhere classified: Secondary | ICD-10-CM | POA: Diagnosis not present

## 2018-02-10 DIAGNOSIS — Z6841 Body Mass Index (BMI) 40.0 and over, adult: Secondary | ICD-10-CM | POA: Diagnosis not present

## 2018-02-10 DIAGNOSIS — F419 Anxiety disorder, unspecified: Secondary | ICD-10-CM | POA: Diagnosis not present

## 2018-02-10 DIAGNOSIS — R109 Unspecified abdominal pain: Secondary | ICD-10-CM | POA: Diagnosis not present

## 2018-02-10 DIAGNOSIS — I10 Essential (primary) hypertension: Secondary | ICD-10-CM | POA: Diagnosis not present

## 2018-02-10 DIAGNOSIS — J45909 Unspecified asthma, uncomplicated: Secondary | ICD-10-CM | POA: Diagnosis not present

## 2018-02-10 DIAGNOSIS — K439 Ventral hernia without obstruction or gangrene: Secondary | ICD-10-CM | POA: Diagnosis not present

## 2018-02-10 DIAGNOSIS — M1990 Unspecified osteoarthritis, unspecified site: Secondary | ICD-10-CM | POA: Diagnosis not present

## 2018-02-10 DIAGNOSIS — R1012 Left upper quadrant pain: Secondary | ICD-10-CM | POA: Diagnosis not present

## 2018-02-10 DIAGNOSIS — R110 Nausea: Secondary | ICD-10-CM | POA: Diagnosis not present

## 2018-02-14 DIAGNOSIS — I10 Essential (primary) hypertension: Secondary | ICD-10-CM | POA: Diagnosis not present

## 2018-02-14 DIAGNOSIS — T148XXA Other injury of unspecified body region, initial encounter: Secondary | ICD-10-CM | POA: Diagnosis not present

## 2018-02-14 DIAGNOSIS — R109 Unspecified abdominal pain: Secondary | ICD-10-CM | POA: Diagnosis not present

## 2018-03-12 DIAGNOSIS — M1712 Unilateral primary osteoarthritis, left knee: Secondary | ICD-10-CM | POA: Diagnosis not present

## 2018-04-24 DIAGNOSIS — Z79899 Other long term (current) drug therapy: Secondary | ICD-10-CM | POA: Diagnosis not present

## 2018-04-24 DIAGNOSIS — Z780 Asymptomatic menopausal state: Secondary | ICD-10-CM | POA: Diagnosis not present

## 2018-04-24 DIAGNOSIS — I10 Essential (primary) hypertension: Secondary | ICD-10-CM | POA: Diagnosis not present

## 2018-04-24 DIAGNOSIS — E559 Vitamin D deficiency, unspecified: Secondary | ICD-10-CM | POA: Diagnosis not present

## 2018-04-24 DIAGNOSIS — F419 Anxiety disorder, unspecified: Secondary | ICD-10-CM | POA: Diagnosis not present

## 2018-05-01 DIAGNOSIS — F331 Major depressive disorder, recurrent, moderate: Secondary | ICD-10-CM | POA: Diagnosis not present

## 2018-05-01 DIAGNOSIS — F411 Generalized anxiety disorder: Secondary | ICD-10-CM | POA: Diagnosis not present

## 2018-05-02 DIAGNOSIS — N3941 Urge incontinence: Secondary | ICD-10-CM | POA: Diagnosis not present

## 2018-05-02 DIAGNOSIS — N951 Menopausal and female climacteric states: Secondary | ICD-10-CM | POA: Diagnosis not present

## 2018-05-02 DIAGNOSIS — Z79899 Other long term (current) drug therapy: Secondary | ICD-10-CM | POA: Diagnosis not present

## 2018-05-27 DIAGNOSIS — N63 Unspecified lump in unspecified breast: Secondary | ICD-10-CM | POA: Diagnosis not present

## 2018-06-03 DIAGNOSIS — N644 Mastodynia: Secondary | ICD-10-CM | POA: Diagnosis not present

## 2018-06-03 DIAGNOSIS — R928 Other abnormal and inconclusive findings on diagnostic imaging of breast: Secondary | ICD-10-CM | POA: Diagnosis not present

## 2018-08-27 DIAGNOSIS — Z0000 Encounter for general adult medical examination without abnormal findings: Secondary | ICD-10-CM | POA: Diagnosis not present

## 2018-08-27 DIAGNOSIS — K219 Gastro-esophageal reflux disease without esophagitis: Secondary | ICD-10-CM | POA: Diagnosis not present

## 2018-08-27 DIAGNOSIS — I10 Essential (primary) hypertension: Secondary | ICD-10-CM | POA: Diagnosis not present

## 2018-08-27 DIAGNOSIS — E538 Deficiency of other specified B group vitamins: Secondary | ICD-10-CM | POA: Diagnosis not present

## 2018-08-27 DIAGNOSIS — E559 Vitamin D deficiency, unspecified: Secondary | ICD-10-CM | POA: Diagnosis not present

## 2018-09-10 DIAGNOSIS — F411 Generalized anxiety disorder: Secondary | ICD-10-CM | POA: Diagnosis not present

## 2018-09-10 DIAGNOSIS — J208 Acute bronchitis due to other specified organisms: Secondary | ICD-10-CM | POA: Diagnosis not present

## 2018-09-10 DIAGNOSIS — F331 Major depressive disorder, recurrent, moderate: Secondary | ICD-10-CM | POA: Diagnosis not present

## 2018-09-15 DIAGNOSIS — F419 Anxiety disorder, unspecified: Secondary | ICD-10-CM | POA: Diagnosis not present

## 2018-09-15 DIAGNOSIS — R1013 Epigastric pain: Secondary | ICD-10-CM | POA: Diagnosis not present

## 2018-09-15 DIAGNOSIS — I10 Essential (primary) hypertension: Secondary | ICD-10-CM | POA: Diagnosis not present

## 2018-09-15 DIAGNOSIS — I517 Cardiomegaly: Secondary | ICD-10-CM | POA: Diagnosis not present

## 2018-09-15 DIAGNOSIS — K219 Gastro-esophageal reflux disease without esophagitis: Secondary | ICD-10-CM | POA: Diagnosis not present

## 2018-09-15 DIAGNOSIS — R072 Precordial pain: Secondary | ICD-10-CM | POA: Diagnosis not present

## 2018-09-15 DIAGNOSIS — R079 Chest pain, unspecified: Secondary | ICD-10-CM | POA: Diagnosis not present

## 2018-09-15 DIAGNOSIS — R05 Cough: Secondary | ICD-10-CM | POA: Diagnosis not present

## 2018-09-15 DIAGNOSIS — Z6841 Body Mass Index (BMI) 40.0 and over, adult: Secondary | ICD-10-CM | POA: Diagnosis not present

## 2018-09-16 DIAGNOSIS — R072 Precordial pain: Secondary | ICD-10-CM | POA: Diagnosis not present

## 2018-09-16 DIAGNOSIS — R9431 Abnormal electrocardiogram [ECG] [EKG]: Secondary | ICD-10-CM | POA: Diagnosis not present

## 2018-09-16 DIAGNOSIS — R079 Chest pain, unspecified: Secondary | ICD-10-CM | POA: Diagnosis not present

## 2018-09-16 DIAGNOSIS — I10 Essential (primary) hypertension: Secondary | ICD-10-CM | POA: Diagnosis not present

## 2018-09-18 DIAGNOSIS — Z09 Encounter for follow-up examination after completed treatment for conditions other than malignant neoplasm: Secondary | ICD-10-CM | POA: Diagnosis not present

## 2018-09-18 DIAGNOSIS — B370 Candidal stomatitis: Secondary | ICD-10-CM | POA: Diagnosis not present

## 2018-09-18 DIAGNOSIS — F419 Anxiety disorder, unspecified: Secondary | ICD-10-CM | POA: Diagnosis not present

## 2018-10-31 DIAGNOSIS — K602 Anal fissure, unspecified: Secondary | ICD-10-CM | POA: Diagnosis not present

## 2018-10-31 DIAGNOSIS — K645 Perianal venous thrombosis: Secondary | ICD-10-CM | POA: Diagnosis not present

## 2018-10-31 DIAGNOSIS — K5900 Constipation, unspecified: Secondary | ICD-10-CM | POA: Diagnosis not present

## 2018-11-29 DIAGNOSIS — E559 Vitamin D deficiency, unspecified: Secondary | ICD-10-CM | POA: Diagnosis not present

## 2018-11-29 DIAGNOSIS — I10 Essential (primary) hypertension: Secondary | ICD-10-CM | POA: Diagnosis not present

## 2018-11-29 DIAGNOSIS — R600 Localized edema: Secondary | ICD-10-CM | POA: Diagnosis not present

## 2018-11-29 DIAGNOSIS — F419 Anxiety disorder, unspecified: Secondary | ICD-10-CM | POA: Diagnosis not present

## 2019-02-11 DIAGNOSIS — Z6841 Body Mass Index (BMI) 40.0 and over, adult: Secondary | ICD-10-CM | POA: Diagnosis not present

## 2019-02-11 DIAGNOSIS — R5383 Other fatigue: Secondary | ICD-10-CM | POA: Diagnosis not present

## 2019-02-17 DIAGNOSIS — F331 Major depressive disorder, recurrent, moderate: Secondary | ICD-10-CM | POA: Diagnosis not present

## 2019-02-17 DIAGNOSIS — F411 Generalized anxiety disorder: Secondary | ICD-10-CM | POA: Diagnosis not present

## 2019-02-27 DIAGNOSIS — D2239 Melanocytic nevi of other parts of face: Secondary | ICD-10-CM | POA: Diagnosis not present

## 2019-02-27 DIAGNOSIS — D485 Neoplasm of uncertain behavior of skin: Secondary | ICD-10-CM | POA: Diagnosis not present

## 2019-03-10 DIAGNOSIS — F329 Major depressive disorder, single episode, unspecified: Secondary | ICD-10-CM | POA: Diagnosis not present

## 2019-03-10 DIAGNOSIS — Z1331 Encounter for screening for depression: Secondary | ICD-10-CM | POA: Diagnosis not present

## 2019-03-10 DIAGNOSIS — F419 Anxiety disorder, unspecified: Secondary | ICD-10-CM | POA: Diagnosis not present

## 2019-03-10 DIAGNOSIS — W57XXXA Bitten or stung by nonvenomous insect and other nonvenomous arthropods, initial encounter: Secondary | ICD-10-CM | POA: Diagnosis not present

## 2019-03-25 DIAGNOSIS — F411 Generalized anxiety disorder: Secondary | ICD-10-CM | POA: Diagnosis not present

## 2019-03-25 DIAGNOSIS — F331 Major depressive disorder, recurrent, moderate: Secondary | ICD-10-CM | POA: Diagnosis not present

## 2019-03-26 DIAGNOSIS — L209 Atopic dermatitis, unspecified: Secondary | ICD-10-CM | POA: Diagnosis not present

## 2019-04-04 DIAGNOSIS — M25512 Pain in left shoulder: Secondary | ICD-10-CM | POA: Diagnosis not present

## 2019-04-09 ENCOUNTER — Other Ambulatory Visit: Payer: Self-pay

## 2019-04-09 ENCOUNTER — Encounter (INDEPENDENT_AMBULATORY_CARE_PROVIDER_SITE_OTHER): Payer: Self-pay

## 2019-04-09 ENCOUNTER — Ambulatory Visit (INDEPENDENT_AMBULATORY_CARE_PROVIDER_SITE_OTHER): Payer: BC Managed Care – PPO | Admitting: Adult Health

## 2019-04-09 DIAGNOSIS — G4700 Insomnia, unspecified: Secondary | ICD-10-CM

## 2019-04-09 DIAGNOSIS — F411 Generalized anxiety disorder: Secondary | ICD-10-CM

## 2019-04-09 DIAGNOSIS — F410 Panic disorder [episodic paroxysmal anxiety] without agoraphobia: Secondary | ICD-10-CM | POA: Diagnosis not present

## 2019-04-09 DIAGNOSIS — F331 Major depressive disorder, recurrent, moderate: Secondary | ICD-10-CM | POA: Diagnosis not present

## 2019-04-16 ENCOUNTER — Other Ambulatory Visit: Payer: Self-pay

## 2019-04-16 ENCOUNTER — Ambulatory Visit (INDEPENDENT_AMBULATORY_CARE_PROVIDER_SITE_OTHER): Payer: BC Managed Care – PPO | Admitting: Adult Health

## 2019-04-16 DIAGNOSIS — F329 Major depressive disorder, single episode, unspecified: Secondary | ICD-10-CM | POA: Diagnosis not present

## 2019-04-16 DIAGNOSIS — F411 Generalized anxiety disorder: Secondary | ICD-10-CM | POA: Diagnosis not present

## 2019-04-16 DIAGNOSIS — F32A Depression, unspecified: Secondary | ICD-10-CM

## 2019-04-16 MED ORDER — FLUOXETINE HCL 20 MG PO CAPS: 20.0000 mg | ORAL_CAPSULE | Freq: Every day | ORAL | 2 refills | Status: DC

## 2019-04-16 NOTE — Progress Notes (Signed)
Sherry Middleton [id number] 1966/05/27 53 y.o.  Subjective:   Patient ID:  Sherry Middleton is a 53 y.o. (DOB 10-22-1965) female.  Chief Complaint:  Chief Complaint  Patient presents with  . Depression  . Anxiety  . Insomnia  . Stress  . Panic Attack    HPI Sherry Middleton presents to the office today for follow-up of depression, anxiety, and insomnia.  Describes mood today as "about the same". Mood symptoms - reports depression, anxiety, and irritability. Feels "panicked" at times.Stating "I feel like I'm doing a little better, until I have to deal with something, then I just break down". Also stating "I feel ridiculous". Also stating "I use to be able to take care of business, but now I can't". Taking Lexapro as prescribed, but feels it makes her "tired". Stating "I just worry all the time". Is willing to try a different medication. Taking medications as prescribed.  Energy levels vary. Active, but does not have a regular exercise routine. Stating "I don't have the energy to do any exercises. Decreased interest and motivation. Currently disabled and unable to work.   Enjoys some usual interests and activities. Spending time with family. Mostly staying at home. Appetite adequate. Weight stable. Sleeps better some nights than others. Averages 6 hours a night. Focus and concentration difficulties. Completing some tasks. Manges aspects of household. Stating "I feel like I have 17 things coming at me at once, and I cant make any descions".  Denies SI or HI.  Denies AH or VH.   Review of Systems:  Review of Systems  Psychiatric/Behavioral: Positive for decreased concentration and sleep disturbance. Negative for suicidal ideas. The patient is nervous/anxious.        Please refer to HPI  All other systems reviewed and are negative.    Medications: I have reviewed the patient's current medications.  Current Outpatient Medications  Medication Sig Dispense Refill  . ALPRAZolam  (XANAX) 0.5 MG tablet Take 0.5 mg by mouth at bedtime as needed for anxiety.    [name] FLUoxetine (PROZAC) 20 MG capsule Take 1 capsule (20 mg total) by mouth daily. 30 capsule 2  . lisinopril-hydrochlorothiazide (PRINZIDE,ZESTORETIC) 20-12.5 MG per tablet      No current facility-administered medications for this visit.     Medication Side Effects: Fatigue and Sedation  Allergies:  Allergies  Allergen Reactions  . Asa [Aspirin]   . Ibuprofen   . Levaquin [Levofloxacin In D5w]     avalox and cipro   . Sulfonamide Derivatives     Past Medical History:  Diagnosis Date  . Anxiety   . GERD (gastroesophageal reflux disease)   . History of weight change   . Hypertension   . Palpitations   . Scar   . Swelling     No family history on file.  Social History   Socioeconomic History  . Marital status: Married    Spouse name: Not on file  . Number of children: Not on file  . Years of education: Not on file  . Highest education level: Not on file  Occupational History  . Not on file  Social Needs  . Financial resource strain: Not on file  . Food insecurity    Worry: Not on file    Inability: Not on file  . Transportation needs    Medical: Not on file    Non-medical: Not on file  Tobacco Use  . Smoking status: Never Smoker  . Smokeless tobacco: Never Used  Substance and Sexual Activity  .  Alcohol use: No  . Drug use: No  . Sexual activity: Not on file  Lifestyle  . Physical activity    Days per week: Not on file    Minutes per session: Not on file  . Stress: Not on file  Relationships  . Social Herbalist on phone: Not on file    Gets together: Not on file    Attends religious service: Not on file    Active member of club or organization: Not on file    Attends meetings of clubs or organizations: Not on file    Relationship status: Not on file  . Intimate partner violence    Fear of current or ex partner: Not on file    Emotionally abused: Not on file     Physically abused: Not on file    Forced sexual activity: Not on file  Other Topics Concern  . Not on file  Social History Narrative  . Not on file    Past Medical History, Surgical history, Social history, and Family history were reviewed and updated as appropriate.   Please see review of systems for further details on the patient's review from today.   Objective:   Physical Exam:  There were no vitals taken for this visit.  Physical Exam Constitutional:      General: She is not in acute distress.    Appearance: She is well-developed.  Musculoskeletal:        General: No deformity.  Neurological:     Mental Status: She is alert and oriented to person, place, and time.     Coordination: Coordination normal.  Psychiatric:        Attention and Perception: Attention and perception normal. She does not perceive auditory or visual hallucinations.        Mood and Affect: Mood is anxious and depressed. Affect is labile and tearful. Affect is not blunt, angry or inappropriate.        Speech: Speech normal.        Behavior: Behavior is withdrawn.        Thought Content: Thought content is not paranoid or delusional. Thought content does not include homicidal or suicidal ideation. Thought content does not include homicidal or suicidal plan.        Cognition and Memory: Cognition and memory normal.     Comments: Insight intact     Lab Review:     Component Value Date/Time   NA 140 10/14/2007 2047   K 4.0 10/14/2007 2047   CL 104 10/14/2007 2047   CO2 25 10/14/2007 2047   GLUCOSE 96 10/14/2007 2047   BUN 9 10/14/2007 2047   CREATININE 0.68 10/14/2007 2047   CALCIUM 8.8 10/14/2007 2047       Component Value Date/Time   WBC 7.3 10/14/2007 2047   RBC 3.98 10/14/2007 2047   HGB 8.3 (L) 10/14/2007 2047   HCT 30.3 (L) 10/14/2007 2047   PLT 380 10/14/2007 2047   MCV 76.1 (L) 10/14/2007 2047   MCHC 27.4 (L) 10/14/2007 2047   RDW 21.9 (H) 10/14/2007 2047   LYMPHSABS 2.0  10/25/2006 1625   MONOABS 0.5 10/25/2006 1625   EOSABS 0.1 10/25/2006 1625   BASOSABS 0.0 10/25/2006 1625    No results found for: POCLITH, LITHIUM   No results found for: PHENYTOIN, PHENOBARB, VALPROATE, CBMZ   .res Assessment: Plan:    Plan: 1. D/C Lexapro 10mg  daily d/t fatigue 2. Add Prozac 20mg  daily  3. Continue Xanax  0.5mg  TID  RTC 05/02/2019 - RTW will be discussed at that time.  Shea was seen today for depression, anxiety, insomnia, stress and panic attack.  Diagnoses and all orders for this visit:  Depression, unspecified depression type -     FLUoxetine (PROZAC) 20 MG capsule; Take 1 capsule (20 mg total) by mouth daily.  GAD (generalized anxiety disorder) -     FLUoxetine (PROZAC) 20 MG capsule; Take 1 capsule (20 mg total) by mouth daily.     Please see After Visit Summary for patient specific instructions.  Future Appointments  Date Time Provider [place]  04/29/2019 11:00 AM [name], [facility] CP-CP None  05/06/2019 10:00 AM Alix Stowers, Berdie Ogren, NP CP-CP None    No orders of the defined types were placed in this encounter.   -------------------------------

## 2019-04-18 ENCOUNTER — Encounter: Payer: Self-pay | Admitting: Adult Health

## 2019-04-26 ENCOUNTER — Encounter: Payer: Self-pay | Admitting: Adult Health

## 2019-04-26 MED ORDER — ESCITALOPRAM OXALATE 10 MG PO TABS: 10.0000 mg | ORAL_TABLET | Freq: Every day | ORAL | 2 refills | Status: DC

## 2019-04-26 MED ORDER — ALPRAZOLAM 0.5 MG PO TABS: 0.5000 mg | ORAL_TABLET | Freq: Three times a day (TID) | ORAL | 2 refills | Status: DC

## 2019-04-26 NOTE — Progress Notes (Signed)
Sherry Middleton [id number] [birth year] 53 y.o.  Subjective:   Patient ID:  Sherry Middleton is a 53 y.o. (DOB [birth year]) female.  Chief Complaint: No chief complaint on file.   HPI Sherry Middleton presents to the office today for follow-up of anxiety, depression, OCD, insomnia, panic attacks.  Describes mood today as "not good". Tearful. Flat. Mood symptoms - reports depression, anxiety, and irritability. Stating "I've been really down". Husband also having issues - recent haemorrhoid surgery with complications. Stating "I'm functioning, but barely". More "anxious and stressed overall". Also stating "I'm in a mess and I never get in this place". Decreased interest and motivation. Is taking Lexapro 10mg  and Xanax 0.5mg  TID as prescribed and feels they have "helped some".  Energy levels decreased. Active, but does not have a regular exercise routine. Enjoys some usual interests and activities. Spending time with family - husband. Coloring in bible. Mostly staying home. Stating "I have a hard time leaving the house".  Appetite adequate.  Weight stable. Sleeps better some nights than others. Up and down with husband. Averages 6 to 8 hours. Focus and concentration dificulties. Overwhelmed easily. Completing tasks. Managing some aspects of household. Does not feel ready to return to work - stating "I just can't do it". Gets easily "overwhelmed".  Denies SI or HI. Denies AH or VH.  Review of Systems:  Review of Systems  Musculoskeletal: Negative for gait problem.  Neurological: Negative for tremors.  Psychiatric/Behavioral:       Please refer to HPI    Medications: I have reviewed the patient's current medications.  Current Outpatient Medications  Medication Sig Dispense Refill  . ALPRAZolam (XANAX) 0.5 MG tablet Take 0.5 mg by mouth at bedtime as needed for anxiety.    [name] FLUoxetine (PROZAC) 20 MG capsule Take 1 capsule (20 mg total) by mouth daily. 30 capsule 2  .  lisinopril-hydrochlorothiazide (PRINZIDE,ZESTORETIC) 20-12.5 MG per tablet      No current facility-administered medications for this visit.     Medication Side Effects: None  Allergies:  Allergies  Allergen Reactions  . Asa [Aspirin]   . Ibuprofen   . Levaquin [Levofloxacin In D5w]     avalox and cipro   . Sulfonamide Derivatives     Past Medical History:  Diagnosis Date  . Anxiety   . Depression   . GERD (gastroesophageal reflux disease)   . History of weight change   . Hypertension   . Hypertension   . Palpitations   . Scar   . Swelling     No family history on file.  Social History   Socioeconomic History  . Marital status: Married    Spouse name: Not on file  . Number of children: Not on file  . Years of education: Not on file  . Highest education level: Not on file  Occupational History  . Not on file  Social Needs  . Financial resource strain: Not on file  . Food insecurity    Worry: Not on file    Inability: Not on file  . Transportation needs    Medical: Not on file    Non-medical: Not on file  Tobacco Use  . Smoking status: Never Smoker  . Smokeless tobacco: Never Used  Substance and Sexual Activity  . Alcohol use: No  . Drug use: No  . Sexual activity: Not on file  Lifestyle  . Physical activity    Days per week: Not on file    Minutes per session: Not on file  .  Stress: Not on file  Relationships  . Social Herbalist on phone: Not on file    Gets together: Not on file    Attends religious service: Not on file    Active member of club or organization: Not on file    Attends meetings of clubs or organizations: Not on file    Relationship status: Not on file  . Intimate partner violence    Fear of current or ex partner: Not on file    Emotionally abused: Not on file    Physically abused: Not on file    Forced sexual activity: Not on file  Other Topics Concern  . Not on file  Social History Narrative  . Not on file     Past Medical History, Surgical history, Social history, and Family history were reviewed and updated as appropriate.   Please see review of systems for further details on the patient's review from today.   Objective:   Physical Exam:  There were no vitals taken for this visit.  Physical Exam Psychiatric:        Attention and Perception: She is inattentive.        Mood and Affect: Mood is anxious and depressed. Affect is flat and tearful.        Behavior: Behavior is withdrawn.     Lab Review:     Component Value Date/Time   NA 140 10/14/2007 2047   K 4.0 10/14/2007 2047   CL 104 10/14/2007 2047   CO2 25 10/14/2007 2047   GLUCOSE 96 10/14/2007 2047   BUN 9 10/14/2007 2047   CREATININE 0.68 10/14/2007 2047   CALCIUM 8.8 10/14/2007 2047       Component Value Date/Time   WBC 7.3 10/14/2007 2047   RBC 3.98 10/14/2007 2047   HGB 8.3 (L) 10/14/2007 2047   HCT 30.3 (L) 10/14/2007 2047   PLT 380 10/14/2007 2047   MCV 76.1 (L) 10/14/2007 2047   MCHC 27.4 (L) 10/14/2007 2047   RDW 21.9 (H) 10/14/2007 2047   LYMPHSABS 2.0 10/25/2006 1625   MONOABS 0.5 10/25/2006 1625   EOSABS 0.1 10/25/2006 1625   BASOSABS 0.0 10/25/2006 1625    No results found for: POCLITH, LITHIUM   No results found for: PHENYTOIN, PHENOBARB, VALPROATE, CBMZ   .res Assessment: Plan:    Plan:  1. Increase Lexapro 10mg  to 20mg  daily - PCP called in  2. Continue Xanax 0.5mg  TID for anxiety and panic attacks 3. Out of work - FMLA - 04/09/2019 to 05/07/2019. RTC 05/06/2019.  RTC 4 weeks  Patient advised to contact office with any questions, adverse effects, or acute worsening in signs and symptoms.  There are no diagnoses linked to this encounter.   Please see After Visit Summary for patient specific instructions.  Future Appointments  Date Time Provider [place]  04/29/2019 11:00 AM [name], [facility] CP-CP None  04/30/2019  9:00 AM Mohsen Odenthal, Berdie Ogren, NP CP-CP None     No orders of the defined types were placed in this encounter.   -------------------------------

## 2019-04-29 ENCOUNTER — Encounter: Payer: Self-pay | Admitting: Psychiatry

## 2019-04-29 ENCOUNTER — Other Ambulatory Visit: Payer: Self-pay

## 2019-04-29 ENCOUNTER — Ambulatory Visit (INDEPENDENT_AMBULATORY_CARE_PROVIDER_SITE_OTHER): Payer: BC Managed Care – PPO | Admitting: Psychiatry

## 2019-04-29 DIAGNOSIS — F411 Generalized anxiety disorder: Secondary | ICD-10-CM
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
## 2019-04-30 ENCOUNTER — Encounter: Payer: Self-pay | Admitting: Adult Health

## 2019-04-30 ENCOUNTER — Other Ambulatory Visit: Payer: Self-pay

## 2019-04-30 ENCOUNTER — Ambulatory Visit (INDEPENDENT_AMBULATORY_CARE_PROVIDER_SITE_OTHER): Payer: BC Managed Care – PPO | Admitting: Adult Health

## 2019-04-30 DIAGNOSIS — F331 Major depressive disorder, recurrent, moderate: Secondary | ICD-10-CM

## 2019-04-30 DIAGNOSIS — F410 Panic disorder [episodic paroxysmal anxiety] without agoraphobia: Secondary | ICD-10-CM | POA: Diagnosis not present

## 2019-04-30 DIAGNOSIS — G4700 Insomnia, unspecified: Secondary | ICD-10-CM

## 2019-04-30 DIAGNOSIS — F411 Generalized anxiety disorder: Secondary | ICD-10-CM | POA: Diagnosis not present

## 2019-04-30 MED ORDER — ALPRAZOLAM 1 MG PO TABS: ORAL_TABLET | ORAL | 2 refills | Status: DC

## 2019-04-30 MED ORDER — ESCITALOPRAM OXALATE 20 MG PO TABS: ORAL_TABLET | ORAL | 5 refills | Status: DC

## 2019-04-30 NOTE — Progress Notes (Signed)
Sherry Middleton [id number] 12-27-1965 53 y.o.  Subjective:   Patient ID:  Sherry Middleton is a 53 y.o. (DOB 1965-09-10) female.  Chief Complaint:  Chief Complaint  Patient presents with  . Anxiety  . Depression  . Panic Attack  . Insomnia    HPI Sherry Middleton presents to the office today for follow-up of depression, anxiety, panic attacks, and insomnia.   Describes mood today as "ok". Tearful throughout interview. Mood symptoms - reports anxiety and irritability. Getting overwhelmed easily. Decreased feelings of depression. Stating "the lexapro has helped me". Also stating "I feel more melancholy". Having good days and bad days. Did not start Prozac - was scared it may cause an allergic reaction because of ingredients she may be allergic to. Has continued to take Lexapro - is up to 15mg  daily. Still feels overwhelmed with "everything". Increased financial stressors. Has had to borrow money to pay her bills - sons helping out. Has received a disabilty check - first one in six weeks. Has not been able to complete taxes - was able to get an extension until October. Stating "I still feel overwhelmed". Saw therapist yesterday and feels it was helpful. Plans to see again in 2 weeks. Therapist feels like she is "compartmentalizing" everything. Sister in law staying with she and husband. She has health issues and has been unable to move out. Husband doing "better". Taking less pain medications. Is "healing". Decreased interest and motivation. Has not cooked in the past two weeks. Having to push herself to do things. Taking medications as prescribed.  Energy levels better some days than others. Active, but does not have a regular exercise routine. Currently disabled and unable to work.   Enjoys some usual interests and activities. Spending time with family - husband and sons. Mostly staying at home unless she likes to live. Appetite adequate. Weight stable. Sleeps better some nights than  others. Averages 6 hours a night. Focus and concentration difficulties - "having issues making decisions". Stating "I feel like I can't prioritize things", "seeing everything at once and unable to separate them. Completing some tasks. Manges some aspects of household.  Denies SI or HI.  Denies AH or VH.  Review of Systems:  Review of Systems  Neurological: Negative for tremors and weakness.  Psychiatric/Behavioral: Positive for decreased concentration and sleep disturbance. Negative for suicidal ideas. The patient is nervous/anxious.        Please refer to HPI  All other systems reviewed and are negative.    Medications: I have reviewed the patient's current medications.  Current Outpatient Medications  Medication Sig Dispense Refill  . ALPRAZolam (XANAX) 1 MG tablet Take 1 tablet three times daily prn anxiety 90 tablet 2  . ANUCORT-HC 25 MG suppository UNW AND I 1 SUP REC HS FOR 6 DAYS. MAY REPEAT AGAIN DURING 1 MONTH    . DUEXIS 800-26.6 MG TABS Take 1 tablet by mouth every 8 (eight) hours as needed.    [name] escitalopram (LEXAPRO) 20 MG tablet Take one tablet daily. 30 tablet 5  . FLUoxetine (PROZAC) 20 MG capsule Take 1 capsule (20 mg total) by mouth daily. 30 capsule 2  . furosemide (LASIX) 20 MG tablet     . lisinopril (ZESTRIL) 20 MG tablet TK 1 T PO D    . lisinopril-hydrochlorothiazide (PRINZIDE,ZESTORETIC) 20-12.5 MG per tablet     . omeprazole (PRILOSEC) 20 MG capsule TK 1 C PO D 30 MINUTES BEFORE MEAL    . polyethylene glycol powder (GLYCOLAX/MIRALAX) 17 GM/SCOOP powder  TAKE 17 GRAMS AFTER DIS IN LIQIUS  QD    . Scar GEL APPLY BID OT AA ON FACE    . spironolactone (ALDACTONE) 25 MG tablet     . Vitamin D, Ergocalciferol, (DRISDOL) 1.25 MG (50000 UT) CAPS capsule TK ONE C PO TWICE WEEKLY    . Wheat Dextrin (BENEFIBER) POWD TK 15 ML AND MIX WITH DRINK DAILY     No current facility-administered medications for this visit.     Medication Side Effects: Fatigue and  Sedation  Allergies:  Allergies  Allergen Reactions  . Asa [Aspirin]   . Ibuprofen   . Levaquin [Levofloxacin In D5w]     avalox and cipro   . Sulfonamide Derivatives     Past Medical History:  Diagnosis Date  . Anxiety   . Depression   . GERD (gastroesophageal reflux disease)   . History of weight change   . Hypertension   . Hypertension   . Palpitations   . Scar   . Swelling     Family History  Problem Relation Age of Onset  . Alcoholism Father     Social History   Socioeconomic History  . Marital status: Married    Spouse name: Not on file  . Number of children: Not on file  . Years of education: Not on file  . Highest education level: Not on file  Occupational History  . Not on file  Social Needs  . Financial resource strain: Not on file  . Food insecurity    Worry: Not on file    Inability: Not on file  . Transportation needs    Medical: Not on file    Non-medical: Not on file  Tobacco Use  . Smoking status: Never Smoker  . Smokeless tobacco: Never Used  Substance and Sexual Activity  . Alcohol use: No  . Drug use: No  . Sexual activity: Not on file  Lifestyle  . Physical activity    Days per week: Not on file    Minutes per session: Not on file  . Stress: Not on file  Relationships  . Social Herbalist on phone: Not on file    Gets together: Not on file    Attends religious service: Not on file    Active member of club or organization: Not on file    Attends meetings of clubs or organizations: Not on file    Relationship status: Not on file  . Intimate partner violence    Fear of current or ex partner: Not on file    Emotionally abused: Not on file    Physically abused: Not on file    Forced sexual activity: Not on file  Other Topics Concern  . Not on file  Social History Narrative  . Not on file    Past Medical History, Surgical history, Social history, and Family history were reviewed and updated as appropriate.    Please see review of systems for further details on the patient's review from today.   Objective:   Physical Exam:  There were no vitals taken for this visit.  Physical Exam Constitutional:      General: She is not in acute distress.    Appearance: She is well-developed.  Musculoskeletal:        General: No deformity.  Neurological:     Mental Status: She is alert and oriented to person, place, and time.     Coordination: Coordination normal.  Psychiatric:  Attention and Perception: Attention and perception normal. She does not perceive auditory or visual hallucinations.        Mood and Affect: Mood is anxious and depressed. Affect is labile, flat and tearful. Affect is not blunt, angry or inappropriate.        Speech: Speech normal.        Behavior: Behavior is withdrawn. Behavior is cooperative.        Thought Content: Thought content normal. Thought content is not paranoid or delusional. Thought content does not include homicidal or suicidal ideation. Thought content does not include homicidal or suicidal plan.        Cognition and Memory: Cognition and memory normal.        Judgment: Judgment normal.     Comments: Insight intact     Lab Review:     Component Value Date/Time   NA 140 10/14/2007 2047   K 4.0 10/14/2007 2047   CL 104 10/14/2007 2047   CO2 25 10/14/2007 2047   GLUCOSE 96 10/14/2007 2047   BUN 9 10/14/2007 2047   CREATININE 0.68 10/14/2007 2047   CALCIUM 8.8 10/14/2007 2047       Component Value Date/Time   WBC 7.3 10/14/2007 2047   RBC 3.98 10/14/2007 2047   HGB 8.3 (L) 10/14/2007 2047   HCT 30.3 (L) 10/14/2007 2047   PLT 380 10/14/2007 2047   MCV 76.1 (L) 10/14/2007 2047   MCHC 27.4 (L) 10/14/2007 2047   RDW 21.9 (H) 10/14/2007 2047   LYMPHSABS 2.0 10/25/2006 1625   MONOABS 0.5 10/25/2006 1625   EOSABS 0.1 10/25/2006 1625   BASOSABS 0.0 10/25/2006 1625    No results found for: POCLITH, LITHIUM   No results found for: PHENYTOIN,  PHENOBARB, VALPROATE, CBMZ   .res Assessment: Plan:    Plan: 1. Continue Lexapro 15mg  daily - increase 20mg  daily 2. Continue Xanax 1mg  TID  Continue therapy  Will continue disability at this time. Patient unable to return to work at this time. Will continue to increase medications as tolerated. Started therapy 04/29/2019, and will continue to see weekly.   Out of work 04/30/2019 through 05/28/2019. Will review a return to work date at that time.  Discussed potential benefits, risk, and side effects of benzodiazepines to include potential risk of tolerance and dependence, as well as possible drowsiness.  Advised patient not to drive if experiencing drowsiness and to take lowest possible effective dose to minimize risk of dependence and tolerance.  Manuel was seen today for anxiety, depression, panic attack and insomnia.  Diagnoses and all orders for this visit:  Generalized anxiety disorder -     ALPRAZolam (XANAX) 1 MG tablet; Take 1 tablet three times daily prn anxiety -     escitalopram (LEXAPRO) 20 MG tablet; Take one tablet daily.  Major depressive disorder, recurrent episode, moderate (HCC) -     escitalopram (LEXAPRO) 20 MG tablet; Take one tablet daily.  Panic attacks -     ALPRAZolam (XANAX) 1 MG tablet; Take 1 tablet three times daily prn anxiety -     escitalopram (LEXAPRO) 20 MG tablet; Take one tablet daily.  Insomnia, unspecified type     Please see After Visit Summary for patient specific instructions.  Future Appointments  Date Time Provider [place]  05/06/2019 10:00 AM [name], [facility] None    No orders of the defined types were placed in this encounter.   -------------------------------

## 2019-05-03 DIAGNOSIS — Z0289 Encounter for other administrative examinations: Secondary | ICD-10-CM

## 2019-05-06 ENCOUNTER — Encounter: Payer: Self-pay | Admitting: Psychiatry

## 2019-05-06 ENCOUNTER — Other Ambulatory Visit: Payer: Self-pay

## 2019-05-06 ENCOUNTER — Ambulatory Visit (INDEPENDENT_AMBULATORY_CARE_PROVIDER_SITE_OTHER): Payer: BC Managed Care – PPO | Admitting: Psychiatry

## 2019-05-06 ENCOUNTER — Ambulatory Visit: Payer: BC Managed Care – PPO | Admitting: Adult Health

## 2019-05-06 DIAGNOSIS — F411 Generalized anxiety disorder: Secondary | ICD-10-CM | POA: Diagnosis not present

## 2019-05-06 NOTE — Progress Notes (Signed)
[facility] Counselor/Therapist Progress Note  Patient ID: Sherry Middleton, MRN: [id number],    Date: 05/06/2019  Time Spent: 52 minutes start time 10:07 a.m. end time 10:59 AM  Treatment Type: Individual Therapy  Reported Symptoms: anxiety, obsessive thinking, feeling overwhelmed, sadness, crying spells, sleep issues, IBS symptoms  Mental Status Exam:  Appearance:   Well Groomed     Behavior:  Appropriate  Motor:  Normal  Speech/Language:   Normal Rate  Affect:  Appropriate  Mood:  anxious  Thought process:  normal  Thought content:    Rumination  Sensory/Perceptual disturbances:    WNL  Orientation:  oriented to person, place, time/date and situation  Attention:  Good  Concentration:  Good  Memory:  WNL  Fund of knowledge:   Good  Insight:    Good  Judgment:   Good  Impulse Control:  Good   Risk Assessment: Danger to Self:  No Self-injurious Behavior: No Danger to Others: No Duty to Warn:no Physical Aggression / Violence:No  Access to Firearms a concern: No  Gang Involvement:No   Subjective: Patient was present for session.  She reported she has been working on turning everything off at 11 and trying to go outside in the morning to get sunshine and praying, it has helped a little bit.   She did share she had a meltdown last week due to feeling overwhelmed in her house with the clutter.  She shared her husband is a borderline hoarder and the stuff in the house can get overwhelming for her.  Patient was able to develop treatment plan in session did not sign due to COVID-19.  Patient discussed the possibility of utilizing E MDR as a treatment option since she does have a history of trauma that seems to be behind some of her anxiety.  Patient reported after last session some memories of her father that were traumatic started to surface.  Discussed how that may have had something to do with her IBS and her body having difficulties.  Had patient think through visual she  could use to help her disconnect from the memories as a surface yet no she will deal with them when she is ready.  Patient decided she would try and visualize a filing cabinet and when memory surface try and visualize following them away until she is ready to deal with them in session.  Patient explained that the thought of being able to do that was helpful for her because she does get overwhelmed when she is flooded with a lot of memories.  Discussed the importance of being able to take care of herself during the intense process of trauma work.  Patient was also encouraged to start working on reminding herself every day that she is value more and that she is enough.  Patient agreed to start working on that since those are the things that she typically does not feel about herself to be triggered.  Patient also shared that she is noticed she seems to be developing a sensitivity to low blood sugar encouraged her to talk to her physician about the issue and things that she can do to take care of herself appropriately.  Interventions: Cognitive Behavioral Therapy and Solution-Oriented/Positive Psychology  Diagnosis:   ICD-10-CM   1. Generalized anxiety disorder  F41.1     Plan: 1.  Patient to continue to engage in individual counseling 2-4 times a month or as needed. 2.  Patient is to use visual of a filing cabinet  when it comes to her trauma to help disconnect from memories that are too disturbing to deal with at the moment.  She is also to work on her affirmations reminding herself that she has value and worth and that she is in the off on a daily basis. 3.  Patient to contact this office, go to the local ED or call 911 if a crisis or emergency develops between visits. Long term goal: Resolve the core conflict that is the source of anxiety Short term goal: Identify the major life conflicts from the past present anxiety Increase understanding of beliefs and messages produce worry and anxiety [name], [facility]

## 2019-05-08 DIAGNOSIS — I10 Essential (primary) hypertension: Secondary | ICD-10-CM | POA: Diagnosis not present

## 2019-05-08 DIAGNOSIS — E538 Deficiency of other specified B group vitamins: Secondary | ICD-10-CM | POA: Diagnosis not present

## 2019-05-08 DIAGNOSIS — E559 Vitamin D deficiency, unspecified: Secondary | ICD-10-CM | POA: Diagnosis not present

## 2019-05-08 DIAGNOSIS — E782 Mixed hyperlipidemia: Secondary | ICD-10-CM | POA: Diagnosis not present

## 2019-05-08 DIAGNOSIS — Z79899 Other long term (current) drug therapy: Secondary | ICD-10-CM | POA: Diagnosis not present

## 2019-05-08 DIAGNOSIS — D500 Iron deficiency anemia secondary to blood loss (chronic): Secondary | ICD-10-CM | POA: Diagnosis not present

## 2019-05-15 ENCOUNTER — Ambulatory Visit (INDEPENDENT_AMBULATORY_CARE_PROVIDER_SITE_OTHER): Payer: BC Managed Care – PPO | Admitting: Psychiatry

## 2019-05-15 ENCOUNTER — Encounter: Payer: Self-pay | Admitting: Psychiatry

## 2019-05-15 ENCOUNTER — Other Ambulatory Visit: Payer: Self-pay

## 2019-05-15 DIAGNOSIS — F411 Generalized anxiety disorder: Secondary | ICD-10-CM

## 2019-05-15 NOTE — Progress Notes (Signed)
    [facility] Counselor/Therapist Progress Note  Patient ID: Sherry Middleton, MRN: [id number],    Date: 05/15/2019  Time Spent: 47 minutes start time 12 PM end time 12:47 PM Virtual Visit via Telephone Note Connected with patient by a video enabled telemedicine/telehealth application , with their informed consent, and verified patient privacy and that I am speaking with the correct person using two identifiers. I discussed the limitations, risks, security and privacy concerns of performing psychotherapy and management service by telephone and the availability of in person appointments. I also discussed with the patient that there may be a patient responsible charge related to this service. The patient expressed understanding and agreed to proceed. I discussed the treatment planning with the patient. The patient was provided an opportunity to ask questions and all were answered. The patient agreed with the plan and demonstrated an understanding of the instructions. The patient was advised to call  our office if  symptoms worsen or feel they are in a crisis state and need immediate contact.   Therapist Location: Office Patient Location: home    Treatment Type: Individual Therapy  Reported Symptoms: Anxiety, pain, crying spells, sadness, rumination, nightmares, focusing issues  Mental Status Exam:  Appearance:   Casual     Behavior:  Sharing  Motor:  Normal  Speech/Language:   Normal Rate  Affect:  Tearful  Mood:  sad  Thought process:  normal  Thought content:    Rumination  Sensory/Perceptual disturbances:    WNL  Orientation:  oriented to person, place, time/date and situation  Attention:  Fair  Concentration:  Fair  Memory:  Immediate;   Fair  Fund of knowledge:   Fair  Insight:    Fair  Judgment:   Good  Impulse Control:  Good   Risk Assessment: Danger to Self:  No Self-injurious Behavior: No Danger to Others: No Duty to Warn:no Physical Aggression / Violence:No   Access to Firearms a concern: No  Gang Involvement:No   Subjective: Met with patient via virtual session through WebEx.  Patient reported she was unable to leave her house because she has had an irritable bowel syndrome flareup and did not feel well.  During session she had waves of nausea and had difficulty focusing at times.  Patient explained that the flare started a few days ago and she is been struggling with pain and nausea since then.  Patient stated she needs to call her gastrologist but she is not sure that she wants to do that.  Patient went on to explain she starts falling apart every time she thinks about going back to work even though she knows she has to go back to work because they cannot financially make it if she does not return to work.  Patient was very tearful as she shared the difficulty she is having with anxiety and functioning.  Patient was encouraged to try and breathe and take things 1 step at a time had patient do a safe place visualization in session to try and give her a way to get grounded in something she can utilize as needed throughout the week.  Patient reported that it did help in session and she felt better.  Patient went on to explain that she was feeling overwhelmed with all that has to happen at her home.  Discussed some of the issues and the importance of breaking down pieces into manageable sizes so that she can accomplish what she needs to.  Discussed the fact that when she   gets overwhelmed she shuts down so she needs to get things to a size that she can complete.  Different CBT strategies to help her do that were discussed in session and plans were developed with patient.  Interventions: Cognitive Behavioral Therapy and Solution-Oriented/Positive Psychology  Diagnosis:   ICD-10-CM   1. Generalized anxiety disorder  F41.1     Plan: Patient is to utilize visualization for relaxation that was completed in session.  She is also to use CBT skills and follow plan to  help her manage feelings of being overwhelmed  Long term goal: Resolved the core conflict that is the source of anxiety Short-term goal: Identify the major life conflicts from the past and present the form the basis of the present anxiety Increase understanding of beliefs and messages that produce the worry and anxiety. Sherry Middleton, LCMHC                  

## 2019-05-20 ENCOUNTER — Ambulatory Visit: Payer: BC Managed Care – PPO | Admitting: Psychiatry

## 2019-05-21 ENCOUNTER — Encounter: Payer: Self-pay | Admitting: Psychiatry

## 2019-05-21 ENCOUNTER — Ambulatory Visit (INDEPENDENT_AMBULATORY_CARE_PROVIDER_SITE_OTHER): Payer: BC Managed Care – PPO | Admitting: Psychiatry

## 2019-05-21 ENCOUNTER — Other Ambulatory Visit: Payer: Self-pay

## 2019-05-21 DIAGNOSIS — F411 Generalized anxiety disorder: Secondary | ICD-10-CM

## 2019-05-21 NOTE — Progress Notes (Signed)
[facility] Counselor/Therapist Progress Note  Patient ID: Sherry Middleton, MRN: [id number],    Date: 05/21/2019  Time Spent: 50 minutes start time 1:11 PM end time 2:01 PM  Treatment Type: Individual Therapy  Reported Symptoms: anxiety, panic, stomach issues, pain, crying spells, memory issue, fatigue, low motivation, depression, sleep issues, difficulty quieting her thoughts  Mental Status Exam:  Appearance:   Casual     Behavior:  Appropriate tearful  Motor:  Normal  Speech/Language:   Normal Rate  Affect:  Congruent  Mood:  anxious  Thought process:  normal  Thought content:    WNL  Sensory/Perceptual disturbances:    WNL  Orientation:  oriented to person, place, time/date and situation  Attention:  Good  Concentration:  Good  Memory:  Immediate;   [place] of knowledge:   Good  Insight:    Good  Judgment:   Good  Impulse Control:  Good   Risk Assessment: Danger to Self:  No Self-injurious Behavior: No Danger to Others: No Duty to Warn:no Physical Aggression / Violence:No  Access to Firearms a concern: No  Gang Involvement:No   Subjective: Patient was present for session.  She explained that she is finding out she has more health issues.  She shared that she has issues in her neck that are causing numbness going down her legs and that her physician wants her to get shots in her neck to see if that helps before they try surgery.  Patient reported she is feeling very uncomfortable about having the shots due to the possibility of paralysis if they do not go well.  Patient explained she is not sure that she can even physically do her job any longer and that her PA has even told her she does not have the capability to do it physically.  Patient explained that is creating lots of panic about returning to work.  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  Patient went on to  explain all of her physical issues and financial concerns have been creating lots of her anxiety which is also led to a flareup of her IBS and created lots of problems in that area as well.  Patient was encouraged to try and take things 1 step at a time.  She was encouraged to try and focus on the things that she can do something about and to try and let go of the things that she can do nothing about.  Patient was encouraged to not worry about work until she talks to her providers to see if they feel that she can return.  Also she was encouraged to focus on trying to remind herself that it is okay to take care of her needs.  Patient explained she has never focused on her needs in the past so trying to do so makes her feel lots of guilt.  She was encouraged just to start repeatting to herself "I can take care of my needs ".  Patient was also encouraged to take time regularly to ground herself.  She shared she had been starting to try and do some quiet meditative time in the mornings and that seemed to be helping her.  She was encouraged to continue doing that on a regular basis to help her start her day with perspective.  She is also to try and break down what she  has to accomplish into realistic pieces rather than taking on more that she is able to accomplish.  Patient was also encouraged to try to delegate what she can to others and to recognize what is in her box and what is not in her box.  Box theory was taught to the patient so that she can utilize that to help maintain perspective.  Patient was also encouraged to try and focus taking care of herself physically and trying to do what will help her start feeling better.  Patient agreed to work on setting small goals exercising and trying to eat in a way that will help her body.  nterventions: Cognitive Behavioral Therapy and Solution-Oriented/Positive Psychology  Diagnosis:   ICD-10-CM   1. Generalized anxiety disorder  F41.1     Plan: Patient is to use  box theory and other CBT skills to help her maintain perspective so that she can focus on the things she can control fix or change.  Is also to work on setting limits and not trying to take on everything herself but feeling comfortable delegating things to her husband and sister-in-law.  Patient is also to work on diet and exercise and trying to break things into manageable pieces so that she does not get overwhelmed.  Long term goal: Resolve the core conflict that is the source of anxiety Short-term goal: Identify the major life complex from the past and present the form the basis for present anxiety Increase understanding of beliefs and messages that produce the worry and anxiety [name], [facility]

## 2019-05-27 ENCOUNTER — Ambulatory Visit: Payer: BC Managed Care – PPO | Admitting: Psychiatry

## 2019-05-28 ENCOUNTER — Ambulatory Visit: Payer: BC Managed Care – PPO | Admitting: Adult Health

## 2019-05-29 ENCOUNTER — Other Ambulatory Visit: Payer: Self-pay

## 2019-05-29 ENCOUNTER — Encounter: Payer: Self-pay | Admitting: Adult Health

## 2019-05-29 ENCOUNTER — Ambulatory Visit (INDEPENDENT_AMBULATORY_CARE_PROVIDER_SITE_OTHER): Payer: BC Managed Care – PPO | Admitting: Adult Health

## 2019-05-29 DIAGNOSIS — G4700 Insomnia, unspecified: Secondary | ICD-10-CM | POA: Diagnosis not present

## 2019-05-29 DIAGNOSIS — F410 Panic disorder [episodic paroxysmal anxiety] without agoraphobia: Secondary | ICD-10-CM | POA: Diagnosis not present

## 2019-05-29 DIAGNOSIS — F411 Generalized anxiety disorder: Secondary | ICD-10-CM | POA: Diagnosis not present

## 2019-05-29 DIAGNOSIS — F331 Major depressive disorder, recurrent, moderate: Secondary | ICD-10-CM

## 2019-05-29 NOTE — Progress Notes (Signed)
[name] [id number] 1966/05/26 53 y.o.  Subjective:   Patient ID:  Sherry Middleton is a 53 y.o. (DOB Jul 12, 1966) female.  Chief Complaint:  Chief Complaint  Patient presents with  . Anxiety  . Depression  . Insomnia  . Other    OCD    HPI Sherry Middleton presents to the office today for follow-up of depression, anxiety, panic attacks, and insomnia.   Describes mood today as "so-so". Tearful throughout interview. Mood symptoms - reports anxiety, depression, and irritability. Stating "I'm trying really hard to get myself back together". Reports having a "few" good days. Anxiety increased because she feels "worthless". Not able to contribute to her family and they rely on her for "income and support". Feels "hopeless" at times. Came close to having a "panic attack" but was able to calm herself down with medications. Tries to tell herself "it's not the end of the world". Wanting to take all the "responsibility, but I can't". Feels bad when people are "counting on her". Therapist led her through a relaxation technique. Take time outside alone - "helps me get up and get grounded - take time for myself". Stating "I'm realizing how important it is to take those moments to quiet my mind". Tearful.  Stating "it's not easy changing the way you are". Increased bouts of IBS - "unable" to attend in person therapy last visit. Plans to call a Gastroenterologist - Dr Lyndel Safe. Was also asked to repeat "I need to listen to my body and take care of myself". Taking on small tasks - "one thing at a time". 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 Stating "it was so overwhelming". Has not started taxes, but "has to get the done". Also asked to figure out what her body can do. Look for the signs of discomfort. Dealing with multiple medical issues. Sister in law staying with she and husband - just had 2 toes amputated. Husband's health has  improved - limited in what he can do. Decreased interest and motivation - but trying to "push" herself. Taking medications as prescribed. Energy levels vary "day to day".  Active, but does not have a regular exercise routine. Has tried to walk - having difficulties with knees and feet. Currently disabled and unable to work.   Enjoys some usual interests and activities. Spending time with family - husband, sister-in-law and 2 sons. Staying home except for appointments.  Appetite adequate -unable to eat much when having an IBS flare-up. Having bouts of nauseas. Weight stable. Sleeps better some nights than others. Was having nightmares, but they are "better". Averages 6 to 8 hours a night. Focus and concentration difficulties. Stating "my brain feels like it's going a 1000 different places". Stating "I can't see the light at the end of the tunnel". States "I've always been able to think and gets things done". Completing some tasks. Manges some aspects of household - "doing a little at a time".   Denies SI or HI. Denies AH or VH.  Review of Systems:  Review of Systems  Neurological: Negative for tremors and weakness.  Psychiatric/Behavioral: Positive for decreased concentration and sleep disturbance. Negative for suicidal ideas. The patient is nervous/anxious.        Please refer to HPI  All other systems reviewed and are negative.    Medications: I have reviewed the patient's current medications.  Current Outpatient Medications  Medication Sig Dispense Refill  . ALPRAZolam Duanne Moron) 1  MG tablet Take 1 tablet three times daily prn anxiety 90 tablet 2  . ANUCORT-HC 25 MG suppository UNW AND I 1 SUP REC HS FOR 6 DAYS. MAY REPEAT AGAIN DURING 1 MONTH    . DUEXIS 800-26.6 MG TABS Take 1 tablet by mouth every 8 (eight) hours as needed.    [name] escitalopram (LEXAPRO) 20 MG tablet Take one tablet daily. 30 tablet 5  . FLUoxetine (PROZAC) 20 MG capsule Take 1 capsule (20 mg total) by mouth daily. 30 capsule  2  . furosemide (LASIX) 20 MG tablet     . lisinopril (ZESTRIL) 20 MG tablet TK 1 T PO D    . lisinopril-hydrochlorothiazide (PRINZIDE,ZESTORETIC) 20-12.5 MG per tablet     . omeprazole (PRILOSEC) 20 MG capsule TK 1 C PO D 30 MINUTES BEFORE MEAL    . polyethylene glycol powder (GLYCOLAX/MIRALAX) 17 GM/SCOOP powder TAKE 17 GRAMS AFTER DIS IN LIQIUS  QD    . Scar GEL APPLY BID OT AA ON FACE    . spironolactone (ALDACTONE) 25 MG tablet     . Vitamin D, Ergocalciferol, (DRISDOL) 1.25 MG (50000 UT) CAPS capsule TK ONE C PO TWICE WEEKLY    . Wheat Dextrin (BENEFIBER) POWD TK 15 ML AND MIX WITH DRINK DAILY     No current facility-administered medications for this visit.     Medication Side Effects: None  Allergies:  Allergies  Allergen Reactions  . Asa [Aspirin]   . Ibuprofen   . Levaquin [Levofloxacin In D5w]     avalox and cipro   . Sulfonamide Derivatives     Past Medical History:  Diagnosis Date  . Anxiety   . Depression   . GERD (gastroesophageal reflux disease)   . History of weight change   . Hypertension   . Hypertension   . Palpitations   . Scar   . Swelling     Family History  Problem Relation Age of Onset  . Alcoholism Father     Social History   Socioeconomic History  . Marital status: Married    Spouse name: Not on file  . Number of children: Not on file  . Years of education: Not on file  . Highest education level: Not on file  Occupational History  . Not on file  Social Needs  . Financial resource strain: Not on file  . Food insecurity    Worry: Not on file    Inability: Not on file  . Transportation needs    Medical: Not on file    Non-medical: Not on file  Tobacco Use  . Smoking status: Never Smoker  . Smokeless tobacco: Never Used  Substance and Sexual Activity  . Alcohol use: No  . Drug use: No  . Sexual activity: Not on file  Lifestyle  . Physical activity    Days per week: Not on file    Minutes per session: Not on file  . Stress:  Not on file  Relationships  . Social Herbalist on phone: Not on file    Gets together: Not on file    Attends religious service: Not on file    Active member of club or organization: Not on file    Attends meetings of clubs or organizations: Not on file    Relationship status: Not on file  . Intimate partner violence    Fear of current or ex partner: Not on file    Emotionally abused: Not on file  Physically abused: Not on file    Forced sexual activity: Not on file  Other Topics Concern  . Not on file  Social History Narrative  . Not on file    Past Medical History, Surgical history, Social history, and Family history were reviewed and updated as appropriate.   Please see review of systems for further details on the patient's review from today.   Objective:   Physical Exam:  There were no vitals taken for this visit.  Physical Exam Constitutional:      General: She is not in acute distress.    Appearance: She is well-developed.  Musculoskeletal:        General: No deformity.  Neurological:     Mental Status: She is alert and oriented to person, place, and time.     Coordination: Coordination normal.  Psychiatric:        Attention and Perception: Perception normal. She is inattentive. She does not perceive auditory or visual hallucinations.        Mood and Affect: Mood is anxious and depressed. Affect is flat and tearful. Affect is not blunt, angry or inappropriate.        Speech: Speech normal.        Behavior: Behavior is withdrawn. Behavior is cooperative.        Thought Content: Thought content normal. Thought content is not paranoid or delusional. Thought content does not include homicidal or suicidal ideation. Thought content does not include homicidal or suicidal plan.        Cognition and Memory: Cognition and memory normal.        Judgment: Judgment normal.     Comments: Insight intact     Lab Review:     Component Value Date/Time   NA 140  10/14/2007 2047   K 4.0 10/14/2007 2047   CL 104 10/14/2007 2047   CO2 25 10/14/2007 2047   GLUCOSE 96 10/14/2007 2047   BUN 9 10/14/2007 2047   CREATININE 0.68 10/14/2007 2047   CALCIUM 8.8 10/14/2007 2047       Component Value Date/Time   WBC 7.3 10/14/2007 2047   RBC 3.98 10/14/2007 2047   HGB 8.3 (L) 10/14/2007 2047   HCT 30.3 (L) 10/14/2007 2047   PLT 380 10/14/2007 2047   MCV 76.1 (L) 10/14/2007 2047   MCHC 27.4 (L) 10/14/2007 2047   RDW 21.9 (H) 10/14/2007 2047   LYMPHSABS 2.0 10/25/2006 1625   MONOABS 0.5 10/25/2006 1625   EOSABS 0.1 10/25/2006 1625   BASOSABS 0.0 10/25/2006 1625    No results found for: POCLITH, LITHIUM   No results found for: PHENYTOIN, PHENOBARB, VALPROATE, CBMZ   .res Assessment: Plan:    Plan: 1. Continue Lexapro will plan to increase to 20mg  daily 2. Continue Xanax 1mg  TID  Continue therapy  Will continue disability at this time. Patient unable to return to work at this time. Will continue to increase medications as tolerated. Started therapy 04/29/2019, and will continue to see weekly.   Out of work 05/28/2019 through 06/26/2019. Will review a return to work date at that time.  Discussed potential benefits, risk, and side effects of benzodiazepines to include potential risk of tolerance and dependence, as well as possible drowsiness.  Advised patient not to drive if experiencing drowsiness and to take lowest possible effective dose to minimize risk of dependence and tolerance.  Ernell was seen today for anxiety, depression, insomnia and other.  Diagnoses and all orders for this visit:  Generalized anxiety disorder  Panic attacks  Major depressive disorder, recurrent episode, moderate (HCC)  Insomnia, unspecified type     Please see After Visit Summary for patient specific instructions.  Future Appointments  Date Time Provider [place]  06/02/2019  2:30 PM Levin Erp, [place] [facility] [place]  06/04/2019  11:00 AM [name], [facility] None  06/11/2019  5:00 PM [name], [facility] CP-CP None  06/17/2019  5:00 PM [name], [facility] CP-CP None  06/25/2019  5:00 PM [name], [facility] CP-CP None  07/01/2019  5:00 PM [name], [facility] CP-CP None    No orders of the defined types were placed in this encounter.   -------------------------------

## 2019-06-02 ENCOUNTER — Ambulatory Visit (INDEPENDENT_AMBULATORY_CARE_PROVIDER_SITE_OTHER): Payer: BC Managed Care – PPO | Admitting: Physician Assistant

## 2019-06-02 ENCOUNTER — Encounter: Payer: Self-pay | Admitting: Physician Assistant

## 2019-06-02 VITALS — BP 128/70 | HR 68 | Temp 97.8°F | Ht 65.5 in | Wt 365.0 lb

## 2019-06-02 DIAGNOSIS — R140 Abdominal distension (gaseous): Secondary | ICD-10-CM | POA: Diagnosis not present

## 2019-06-02 DIAGNOSIS — R194 Change in bowel habit: Secondary | ICD-10-CM | POA: Diagnosis not present

## 2019-06-02 DIAGNOSIS — K219 Gastro-esophageal reflux disease without esophagitis: Secondary | ICD-10-CM

## 2019-06-02 DIAGNOSIS — R1013 Epigastric pain: Secondary | ICD-10-CM | POA: Diagnosis not present

## 2019-06-02 MED ORDER — METOCLOPRAMIDE HCL 10 MG PO TABS: ORAL_TABLET | ORAL | 0 refills | Status: DC

## 2019-06-02 MED ORDER — SUPREP BOWEL PREP KIT 17.5-3.13-1.6 GM/177ML PO SOLN: 1.0000 | ORAL | 0 refills | Status: DC

## 2019-06-02 MED ORDER — OMEPRAZOLE 40 MG PO CPDR: 40.0000 mg | DELAYED_RELEASE_CAPSULE | Freq: Two times a day (BID) | ORAL | 3 refills | Status: DC

## 2019-06-02 NOTE — Progress Notes (Signed)
Chief Complaint: IBS and abdominal pain  HPI:    Sherry Middleton is a 53 year old female with a past medical history as listed below including anxiety and depression and previous cholecystectomy, who was referred to me by Earlyne Iba, NP for a complaint of IBS and and abdominal pain.    Today, the patient tells me she has followed with Dr. Melina Copa down in [place] for many years but is wanting to transfer her care up to [place].  Tells me she has a long history of GI issues including multiple fissures and "ulcers all over my stomach due to Ibuprofen".  For many years she has had "bipolar bowels" telling me that she can go from constipation to diarrhea.  Often has the "bathroom trots".  This worsened after her cholecystectomy telling me that she almost had to run to or be in the bathroom anytime she ate anything.  Again though she can still have days of constipation in between.  She will occasionally use milk of magnesia when this occurs.  Has also tried a mixture of probiotics in the past.  Tells me she has never actually been diagnosed with IBS, though everyone has talked about it. No previous colonoscopy.    Most recently she noted that the stress of work and life finally got to her and she had an increase of all of the symptoms.  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      Patient's biggest complaint today is a sharp gnawing epigastric pain which feels like she has been "kicked by a horse" or is also sharp and stinging sometimes over the past month.  Rated as a 8-9/10 at its worst. This is associated with nausea, she has used Zofran occasionally which helps when it gets to be too bad.  Currently taking Omeprazole 20 mg daily over the past 3 weeks or so, which she is not sure is helping.  Also describes that for a few months she was having a sensation of  getting "strangled easily" when she would eat anything, but this is gotten some better over the past month or so.    Denies fever, chills, weight loss or symptoms that awaken her from sleep.    Past Medical History:  Diagnosis Date  . Anxiety   . Depression   . GERD (gastroesophageal reflux disease)   . History of weight change   . Hypertension   . Hypertension   . Palpitations   . Scar   . Swelling     Past Surgical History:  Procedure Laterality Date  . CHOLECYSTECTOMY    . dnc surgery    . TUBAL LIGATION      Current Outpatient Medications  Medication Sig Dispense Refill  . ALPRAZolam (XANAX) 1 MG tablet Take 1 tablet three times daily prn anxiety 90 tablet 2  . ANUCORT-HC 25 MG suppository UNW AND I 1 SUP REC HS FOR 6 DAYS. MAY REPEAT AGAIN DURING 1 MONTH    . DUEXIS 800-26.6 MG TABS Take 1 tablet by mouth every 8 (eight) hours as needed.    [name] escitalopram (LEXAPRO) 20 MG tablet Take one tablet daily. 30 tablet 5  . FLUoxetine (PROZAC) 20 MG capsule Take 1 capsule (20 mg total) by mouth daily. 30 capsule 2  . furosemide (LASIX) 20 MG tablet     . lisinopril (ZESTRIL) 20 MG tablet  TK 1 T PO D    . lisinopril-hydrochlorothiazide (PRINZIDE,ZESTORETIC) 20-12.5 MG per tablet     . omeprazole (PRILOSEC) 20 MG capsule TK 1 C PO D 30 MINUTES BEFORE MEAL    . polyethylene glycol powder (GLYCOLAX/MIRALAX) 17 GM/SCOOP powder TAKE 17 GRAMS AFTER DIS IN LIQIUS  QD    . Scar GEL APPLY BID OT AA ON FACE    . spironolactone (ALDACTONE) 25 MG tablet     . Vitamin D, Ergocalciferol, (DRISDOL) 1.25 MG (50000 UT) CAPS capsule TK ONE C PO TWICE WEEKLY    . Wheat Dextrin (BENEFIBER) POWD TK 15 ML AND MIX WITH DRINK DAILY     No current facility-administered medications for this visit.     Allergies as of 06/02/2019 - Review Complete 05/21/2019  Allergen Reaction Noted  . Asa [aspirin]  12/18/2013  . Ibuprofen  12/18/2013  . Levaquin [levofloxacin in d5w]  12/18/2013  . Sulfonamide  derivatives  10/25/2006    Family History  Problem Relation Age of Onset  . Alcoholism Father     Social History   Socioeconomic History  . Marital status: Married    Spouse name: Not on file  . Number of children: Not on file  . Years of education: Not on file  . Highest education level: Not on file  Occupational History  . Not on file  Social Needs  . Financial resource strain: Not on file  . Food insecurity    Worry: Not on file    Inability: Not on file  . Transportation needs    Medical: Not on file    Non-medical: Not on file  Tobacco Use  . Smoking status: Never Smoker  . Smokeless tobacco: Never Used  Substance and Sexual Activity  . Alcohol use: No  . Drug use: No  . Sexual activity: Not on file  Lifestyle  . Physical activity    Days per week: Not on file    Minutes per session: Not on file  . Stress: Not on file  Relationships  . Social Herbalist on phone: Not on file    Gets together: Not on file    Attends religious service: Not on file    Active member of club or organization: Not on file    Attends meetings of clubs or organizations: Not on file    Relationship status: Not on file  . Intimate partner violence    Fear of current or ex partner: Not on file    Emotionally abused: Not on file    Physically abused: Not on file    Forced sexual activity: Not on file  Other Topics Concern  . Not on file  Social History Narrative  . Not on file    Review of Systems:    Constitutional: No weight loss, fever or chills Skin: No rash  Cardiovascular: No chest pain Respiratory: No SOB  Gastrointestinal: See HPI and otherwise negative Genitourinary: No dysuria  Neurological: No headache, dizziness or syncope Musculoskeletal: No new muscle or joint pain Hematologic: No bleeding  Psychiatric: +anxiety and depression   Physical Exam:  Vital signs: BP 128/70   Pulse 68   Temp 97.8 F (36.6 C)   Ht 5' 5.5" (1.664 m) Comment: without  shoes  Wt (!) 365 lb (165.6 kg)   BMI 59.82 kg/m   Constitutional:   Pleasant Obese Caucasian female appears to be in NAD, Well developed, Well nourished, alert and cooperative Head:  Normocephalic and  atraumatic. Eyes:   PEERL, EOMI. No icterus. Conjunctiva pink. Ears:  Normal auditory acuity. Neck:  Supple Throat: Oral cavity and pharynx without inflammation, swelling or lesion.  Respiratory: Respirations even and unlabored. Lungs clear to auscultation bilaterally.   No wheezes, crackles, or rhonchi.  Cardiovascular: Normal S1, S2. No MRG. Regular rate and rhythm. No peripheral edema, cyanosis or pallor.  Gastrointestinal:  Soft, nondistended, marked ttp in the epigastrum with involuntary guarding, Normal bowel sounds. No appreciable masses or hepatomegaly. Rectal:  Not performed.  Msk:  Symmetrical without gross deformities. Without edema, no deformity or joint abnormality.  Neurologic:  Alert and  oriented x4;  grossly normal neurologically.  Skin:   Dry and intact without significant lesions or rashes. Psychiatric:  Demonstrates good judgement and reason without abnormal affect or behaviors.  No recent labs or imaging available.  Assessment: 1.  Epigastric abdominal pain: Concern for PUD versus gastritis+/-H. pylori 2.  Change in bowel habits: Radiating back-and-forth from constipation to diarrhea, most likely IBS 3.  GERD: With epigastric pain 4.  Dysphagia: Better over the past month, but some difficulty with "strangling easily"; consider esophageal stricture versus dysmotility  Plan: 1.  We will request records from Dr. Melina Copa 2.  Schedule patient for an EGD (possible dilation) and colonoscopy in the hospital with Dr. Havery Moros given her increased BMI.  Did discuss risks, benefits, limitations and alternatives the patient agrees to proceed. 3.  Prescribed Reglan 10 mg #2, to be taken with prep 4.  Increased omeprazole to 40 mg twice daily, 36 minutes before breakfast and  dinner #60 with 3 refills 5.  Reviewed antireflux diet and lifestyle modifications 6.  Discussed IBS, it does sound as though patient's symptoms fit this diagnosis, though colonoscopy will ensure that this is the correct diagnosis.  Discussed align once daily for the next 2 months as well as increase fiber intake and water. 7.  Patient follow clinic per recommendations of Dr. Havery Moros after time procedure.  Ellouise Newer, PA-C [facility] 06/02/2019, 2:42 PM  Cc: Earlyne Iba, NP

## 2019-06-02 NOTE — Patient Instructions (Addendum)
Due to recent COVID-19 restrictions implemented by our local and state authorities and in an effort to keep both patients and staff as safe as possible, our hospital system now requires COVID-19 testing prior to any scheduled hospital procedure. Please go to our [facility] location drive thru testing site ([address] [place], [place] [address]) on 06/06/19 at  10:50am. There will be multiple testing areas, the first checkpoint being for pre-procedure/surgery testing. Get into the right (yellow) lane that leads to the PAT testing team. You will not be billed at the time of testing but may receive a bill later depending on your insurance. The approximate cost of the test is $100. You must agree to quarantine from the time of your testing until the procedure date on 06/10/2019 . This should include staying at home with ONLY the people you live with. Avoid take-out, grocery store shopping or leaving the house for any non-emergent reason. Failure to have your COVID-19 test done on the date and time you have been scheduled will result in cancellation of procedure. Please call our office at [phone] if you have any questions.   You have been scheduled for an endoscopy and colonoscopy. Please follow the written instructions given to you at your visit today. Please pick up your prep supplies at the pharmacy within the next 1-3 days. If you use inhalers (even only as needed), please bring them with you on the day of your procedure.  We have sent the following medications to your pharmacy for you to pick up at your convenience: Reglan and Suprep  Increase Omeprazole 40mg  to twice daily.   Start Align once daily- samples given today.   A high fiber diet with plenty of fluids (up to 8 glasses of water daily) is suggested to relieve these symptoms.  Metamucil, 1 tablespoon once or twice daily can be used to keep bowels regular if needed.  Thank you for choosing me and [facility].  Dennison Bulla

## 2019-06-02 NOTE — H&P (View-Only) (Signed)
Chief Complaint: IBS and abdominal pain  HPI:    Sherry Middleton is a 53 year old female with a past medical history as listed below including anxiety and depression and previous cholecystectomy, who was referred to me by Earlyne Iba, NP for a complaint of IBS and and abdominal pain.    Today, the patient tells me she has followed with Dr. Melina Copa down in [place] for many years but is wanting to transfer her care up to [place].  Tells me she has a long history of GI issues including multiple fissures and "ulcers all over my stomach due to Ibuprofen".  For many years she has had "bipolar bowels" telling me that she can go from constipation to diarrhea.  Often has the "bathroom trots".  This worsened after her cholecystectomy telling me that she almost had to run to or be in the bathroom anytime she ate anything.  Again though she can still have days of constipation in between.  She will occasionally use milk of magnesia when this occurs.  Has also tried a mixture of probiotics in the past.  Tells me she has never actually been diagnosed with IBS, though everyone has talked about it. No previous colonoscopy.    Most recently she noted that the stress of work and life finally got to her and she had an increase of all of the symptoms.  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      Patient's biggest complaint today is a sharp gnawing epigastric pain which feels like she has been "kicked by a horse" or is also sharp and stinging sometimes over the past month.  Rated as a 8-9/10 at its worst. This is associated with nausea, she has used Zofran occasionally which helps when it gets to be too bad.  Currently taking Omeprazole 20 mg daily over the past 3 weeks or so, which she is not sure is helping.  Also describes that for a few months she was having a sensation of  getting "strangled easily" when she would eat anything, but this is gotten some better over the past month or so.    Denies fever, chills, weight loss or symptoms that awaken her from sleep.    Past Medical History:  Diagnosis Date  . Anxiety   . Depression   . GERD (gastroesophageal reflux disease)   . History of weight change   . Hypertension   . Hypertension   . Palpitations   . Scar   . Swelling     Past Surgical History:  Procedure Laterality Date  . CHOLECYSTECTOMY    . dnc surgery    . TUBAL LIGATION      Current Outpatient Medications  Medication Sig Dispense Refill  . ALPRAZolam (XANAX) 1 MG tablet Take 1 tablet three times daily prn anxiety 90 tablet 2  . ANUCORT-HC 25 MG suppository UNW AND I 1 SUP REC HS FOR 6 DAYS. MAY REPEAT AGAIN DURING 1 MONTH    . DUEXIS 800-26.6 MG TABS Take 1 tablet by mouth every 8 (eight) hours as needed.    [name] escitalopram (LEXAPRO) 20 MG tablet Take one tablet daily. 30 tablet 5  . FLUoxetine (PROZAC) 20 MG capsule Take 1 capsule (20 mg total) by mouth daily. 30 capsule 2  . furosemide (LASIX) 20 MG tablet     . lisinopril (ZESTRIL) 20 MG tablet  TK 1 T PO D    . lisinopril-hydrochlorothiazide (PRINZIDE,ZESTORETIC) 20-12.5 MG per tablet     . omeprazole (PRILOSEC) 20 MG capsule TK 1 C PO D 30 MINUTES BEFORE MEAL    . polyethylene glycol powder (GLYCOLAX/MIRALAX) 17 GM/SCOOP powder TAKE 17 GRAMS AFTER DIS IN LIQIUS  QD    . Scar GEL APPLY BID OT AA ON FACE    . spironolactone (ALDACTONE) 25 MG tablet     . Vitamin D, Ergocalciferol, (DRISDOL) 1.25 MG (50000 UT) CAPS capsule TK ONE C PO TWICE WEEKLY    . Wheat Dextrin (BENEFIBER) POWD TK 15 ML AND MIX WITH DRINK DAILY     No current facility-administered medications for this visit.     Allergies as of 06/02/2019 - Review Complete 05/21/2019  Allergen Reaction Noted  . Asa [aspirin]  12/18/2013  . Ibuprofen  12/18/2013  . Levaquin [levofloxacin in d5w]  12/18/2013  . Sulfonamide  derivatives  10/25/2006    Family History  Problem Relation Age of Onset  . Alcoholism Father     Social History   Socioeconomic History  . Marital status: Married    Spouse name: Not on file  . Number of children: Not on file  . Years of education: Not on file  . Highest education level: Not on file  Occupational History  . Not on file  Social Needs  . Financial resource strain: Not on file  . Food insecurity    Worry: Not on file    Inability: Not on file  . Transportation needs    Medical: Not on file    Non-medical: Not on file  Tobacco Use  . Smoking status: Never Smoker  . Smokeless tobacco: Never Used  Substance and Sexual Activity  . Alcohol use: No  . Drug use: No  . Sexual activity: Not on file  Lifestyle  . Physical activity    Days per week: Not on file    Minutes per session: Not on file  . Stress: Not on file  Relationships  . Social Herbalist on phone: Not on file    Gets together: Not on file    Attends religious service: Not on file    Active member of club or organization: Not on file    Attends meetings of clubs or organizations: Not on file    Relationship status: Not on file  . Intimate partner violence    Fear of current or ex partner: Not on file    Emotionally abused: Not on file    Physically abused: Not on file    Forced sexual activity: Not on file  Other Topics Concern  . Not on file  Social History Narrative  . Not on file    Review of Systems:    Constitutional: No weight loss, fever or chills Skin: No rash  Cardiovascular: No chest pain Respiratory: No SOB  Gastrointestinal: See HPI and otherwise negative Genitourinary: No dysuria  Neurological: No headache, dizziness or syncope Musculoskeletal: No new muscle or joint pain Hematologic: No bleeding  Psychiatric: +anxiety and depression   Physical Exam:  Vital signs: BP 128/70   Pulse 68   Temp 97.8 F (36.6 C)   Ht 5' 5.5" (1.664 m) Comment: without  shoes  Wt (!) 365 lb (165.6 kg)   BMI 59.82 kg/m   Constitutional:   Pleasant Obese Caucasian female appears to be in NAD, Well developed, Well nourished, alert and cooperative Head:  Normocephalic and  atraumatic. Eyes:   PEERL, EOMI. No icterus. Conjunctiva pink. Ears:  Normal auditory acuity. Neck:  Supple Throat: Oral cavity and pharynx without inflammation, swelling or lesion.  Respiratory: Respirations even and unlabored. Lungs clear to auscultation bilaterally.   No wheezes, crackles, or rhonchi.  Cardiovascular: Normal S1, S2. No MRG. Regular rate and rhythm. No peripheral edema, cyanosis or pallor.  Gastrointestinal:  Soft, nondistended, marked ttp in the epigastrum with involuntary guarding, Normal bowel sounds. No appreciable masses or hepatomegaly. Rectal:  Not performed.  Msk:  Symmetrical without gross deformities. Without edema, no deformity or joint abnormality.  Neurologic:  Alert and  oriented x4;  grossly normal neurologically.  Skin:   Dry and intact without significant lesions or rashes. Psychiatric:  Demonstrates good judgement and reason without abnormal affect or behaviors.  No recent labs or imaging available.  Assessment: 1.  Epigastric abdominal pain: Concern for PUD versus gastritis+/-H. pylori 2.  Change in bowel habits: Radiating back-and-forth from constipation to diarrhea, most likely IBS 3.  GERD: With epigastric pain 4.  Dysphagia: Better over the past month, but some difficulty with "strangling easily"; consider esophageal stricture versus dysmotility  Plan: 1.  We will request records from Dr. Melina Copa 2.  Schedule patient for an EGD (possible dilation) and colonoscopy in the hospital with Dr. Havery Moros given her increased BMI.  Did discuss risks, benefits, limitations and alternatives the patient agrees to proceed. 3.  Prescribed Reglan 10 mg #2, to be taken with prep 4.  Increased omeprazole to 40 mg twice daily, 36 minutes before breakfast and  dinner #60 with 3 refills 5.  Reviewed antireflux diet and lifestyle modifications 6.  Discussed IBS, it does sound as though patient's symptoms fit this diagnosis, though colonoscopy will ensure that this is the correct diagnosis.  Discussed align once daily for the next 2 months as well as increase fiber intake and water. 7.  Patient follow clinic per recommendations of Dr. Havery Moros after time procedure.  Ellouise Newer, PA-C [facility] 06/02/2019, 2:42 PM  Cc: Earlyne Iba, NP

## 2019-06-03 NOTE — Progress Notes (Signed)
Agree with assessment and plan as outlined.  

## 2019-06-04 ENCOUNTER — Other Ambulatory Visit: Payer: Self-pay

## 2019-06-04 ENCOUNTER — Encounter: Payer: Self-pay | Admitting: Psychiatry

## 2019-06-04 ENCOUNTER — Ambulatory Visit (INDEPENDENT_AMBULATORY_CARE_PROVIDER_SITE_OTHER): Payer: BC Managed Care – PPO | Admitting: Psychiatry

## 2019-06-04 DIAGNOSIS — F410 Panic disorder [episodic paroxysmal anxiety] without agoraphobia: Secondary | ICD-10-CM

## 2019-06-04 DIAGNOSIS — F411 Generalized anxiety disorder: Secondary | ICD-10-CM | POA: Diagnosis not present

## 2019-06-04 DIAGNOSIS — F331 Major depressive disorder, recurrent, moderate: Secondary | ICD-10-CM | POA: Diagnosis not present
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
## 2019-06-06 ENCOUNTER — Encounter (HOSPITAL_COMMUNITY): Payer: Self-pay | Admitting: *Deleted

## 2019-06-06 ENCOUNTER — Telehealth: Payer: Self-pay | Admitting: Gastroenterology

## 2019-06-06 ENCOUNTER — Other Ambulatory Visit (HOSPITAL_COMMUNITY)
Admission: RE | Admit: 2019-06-06 | Discharge: 2019-06-06 | Disposition: A | Discharge status: Home / Self Care | Payer: BC Managed Care – PPO | Source: Ambulatory Visit | Attending: Gastroenterology | Admitting: Gastroenterology

## 2019-06-06 DIAGNOSIS — Z20828 Contact with and (suspected) exposure to other viral communicable diseases: Secondary | ICD-10-CM | POA: Diagnosis not present

## 2019-06-06 DIAGNOSIS — Z01812 Encounter for preprocedural laboratory examination: Secondary | ICD-10-CM | POA: Insufficient documentation

## 2019-06-06 NOTE — Progress Notes (Signed)
Patient to arrive to admitting 1045 on 06/10/2019 for colonoscopy and EGD by Dr Havery Moros. She has had her covid test this AM and has bowel prep from GI office. She verbalizes that she has instructions from GI office about bowel prep for 06/09/2019.

## 2019-06-06 NOTE — Telephone Encounter (Signed)
Pt called stating that she was returning your call, she said that it is ok to move her appt up, she has a couple of questions so would like a call back.

## 2019-06-06 NOTE — Telephone Encounter (Signed)
Called patient back, she can move to the 9;30am slot on 06/10/19. Answered her questions about the prep.

## 2019-06-08 LAB — NOVEL CORONAVIRUS, NAA (HOSP ORDER, SEND-OUT TO REF LAB; TAT 18-24 HRS): SARS-CoV-2, NAA: NOT DETECTED

## 2019-06-09 NOTE — Progress Notes (Signed)
Called to confirm  Appointment for tomorrow. Pt stated she had been able to quarantine and has not had any fevers or flu like symptoms. Questions answered.

## 2019-06-10 ENCOUNTER — Ambulatory Visit (HOSPITAL_COMMUNITY): Payer: BC Managed Care – PPO | Admitting: Registered Nurse

## 2019-06-10 ENCOUNTER — Other Ambulatory Visit: Payer: Self-pay

## 2019-06-10 ENCOUNTER — Encounter (HOSPITAL_COMMUNITY)
Admission: RE | Disposition: A | Discharge status: Home / Self Care | Payer: Self-pay | Source: Home / Self Care | Attending: Gastroenterology

## 2019-06-10 ENCOUNTER — Ambulatory Visit (HOSPITAL_COMMUNITY)
Admission: RE | Admit: 2019-06-10 | Discharge: 2019-06-10 | Disposition: A | Discharge status: Home / Self Care | Payer: BC Managed Care – PPO | Attending: Gastroenterology | Admitting: Gastroenterology

## 2019-06-10 DIAGNOSIS — I10 Essential (primary) hypertension: Secondary | ICD-10-CM | POA: Insufficient documentation

## 2019-06-10 DIAGNOSIS — R140 Abdominal distension (gaseous): Secondary | ICD-10-CM

## 2019-06-10 DIAGNOSIS — R194 Change in bowel habit: Secondary | ICD-10-CM

## 2019-06-10 DIAGNOSIS — D125 Benign neoplasm of sigmoid colon: Secondary | ICD-10-CM | POA: Diagnosis not present

## 2019-06-10 DIAGNOSIS — Z9049 Acquired absence of other specified parts of digestive tract: Secondary | ICD-10-CM | POA: Insufficient documentation

## 2019-06-10 DIAGNOSIS — Z888 Allergy status to other drugs, medicaments and biological substances status: Secondary | ICD-10-CM | POA: Insufficient documentation

## 2019-06-10 DIAGNOSIS — Z79899 Other long term (current) drug therapy: Secondary | ICD-10-CM | POA: Insufficient documentation

## 2019-06-10 DIAGNOSIS — K229 Disease of esophagus, unspecified: Secondary | ICD-10-CM | POA: Diagnosis not present

## 2019-06-10 DIAGNOSIS — R002 Palpitations: Secondary | ICD-10-CM | POA: Insufficient documentation

## 2019-06-10 DIAGNOSIS — Z882 Allergy status to sulfonamides status: Secondary | ICD-10-CM | POA: Diagnosis not present

## 2019-06-10 DIAGNOSIS — F329 Major depressive disorder, single episode, unspecified: Secondary | ICD-10-CM | POA: Diagnosis not present

## 2019-06-10 DIAGNOSIS — Z7982 Long term (current) use of aspirin: Secondary | ICD-10-CM | POA: Diagnosis not present

## 2019-06-10 DIAGNOSIS — K648 Other hemorrhoids: Secondary | ICD-10-CM | POA: Insufficient documentation

## 2019-06-10 DIAGNOSIS — F419 Anxiety disorder, unspecified: Secondary | ICD-10-CM | POA: Diagnosis not present

## 2019-06-10 DIAGNOSIS — K5730 Diverticulosis of large intestine without perforation or abscess without bleeding: Secondary | ICD-10-CM | POA: Diagnosis not present

## 2019-06-10 DIAGNOSIS — K644 Residual hemorrhoidal skin tags: Secondary | ICD-10-CM | POA: Insufficient documentation

## 2019-06-10 DIAGNOSIS — Z886 Allergy status to analgesic agent status: Secondary | ICD-10-CM | POA: Insufficient documentation

## 2019-06-10 DIAGNOSIS — Z6841 Body Mass Index (BMI) 40.0 and over, adult: Secondary | ICD-10-CM | POA: Diagnosis not present

## 2019-06-10 DIAGNOSIS — K580 Irritable bowel syndrome with diarrhea: Secondary | ICD-10-CM | POA: Insufficient documentation

## 2019-06-10 DIAGNOSIS — K635 Polyp of colon: Secondary | ICD-10-CM

## 2019-06-10 DIAGNOSIS — R1310 Dysphagia, unspecified: Secondary | ICD-10-CM

## 2019-06-10 DIAGNOSIS — Z791 Long term (current) use of non-steroidal anti-inflammatories (NSAID): Secondary | ICD-10-CM | POA: Diagnosis not present

## 2019-06-10 DIAGNOSIS — R1013 Epigastric pain: Secondary | ICD-10-CM

## 2019-06-10 DIAGNOSIS — K2950 Unspecified chronic gastritis without bleeding: Secondary | ICD-10-CM | POA: Insufficient documentation

## 2019-06-10 DIAGNOSIS — K219 Gastro-esophageal reflux disease without esophagitis: Secondary | ICD-10-CM | POA: Diagnosis not present

## 2019-06-10 HISTORY — PX: BIOPSY: SHX5522

## 2019-06-10 HISTORY — PX: ESOPHAGOGASTRODUODENOSCOPY (EGD) WITH PROPOFOL: SHX5813

## 2019-06-10 HISTORY — PX: COLONOSCOPY WITH PROPOFOL: SHX5780

## 2019-06-10 HISTORY — PX: POLYPECTOMY: SHX5525

## 2019-06-10 SURGERY — COLONOSCOPY WITH PROPOFOL
Anesthesia: Monitor Anesthesia Care

## 2019-06-10 MED ORDER — LACTATED RINGERS IV SOLN
INTRAVENOUS | Status: AC | PRN
  Administered 2019-06-10: 1000 mL via INTRAVENOUS

## 2019-06-10 MED ORDER — PROPOFOL 500 MG/50ML IV EMUL
INTRAVENOUS | Status: DC | PRN
  Administered 2019-06-10: 130 ug/kg/min via INTRAVENOUS

## 2019-06-10 MED ORDER — PROPOFOL 10 MG/ML IV BOLUS
INTRAVENOUS | Status: AC
  Filled 2019-06-10: qty 60

## 2019-06-10 MED ORDER — SODIUM CHLORIDE 0.9 % IV SOLN: INTRAVENOUS | Status: DC

## 2019-06-10 MED ORDER — PROPOFOL 10 MG/ML IV BOLUS
INTRAVENOUS | Status: AC
  Filled 2019-06-10: qty 20

## 2019-06-10 MED ORDER — PROPOFOL 10 MG/ML IV BOLUS
INTRAVENOUS | Status: DC | PRN
  Administered 2019-06-10: 20 mg via INTRAVENOUS

## 2019-06-10 SURGICAL SUPPLY — 25 items

## 2019-06-10 NOTE — Anesthesia Postprocedure Evaluation (Signed)
Anesthesia Post Note  Patient: Naydelin Ziegler  Procedure(s) Performed: COLONOSCOPY WITH PROPOFOL (N/A ) ESOPHAGOGASTRODUODENOSCOPY (EGD) WITH PROPOFOL (N/A ) BIOPSY POLYPECTOMY     Patient location during evaluation: Endoscopy Anesthesia Type: MAC Level of consciousness: awake and alert Pain management: pain level controlled Vital Signs Assessment: post-procedure vital signs reviewed and stable Respiratory status: spontaneous breathing, nonlabored ventilation and respiratory function stable Cardiovascular status: blood pressure returned to baseline and stable Postop Assessment: no apparent nausea or vomiting Anesthetic complications: no    Last Vitals:  Vitals:   06/10/19 1030 06/10/19 1040  BP: (!) 144/77 (!) 147/71  Pulse: 66 64  Resp: 15 16  Temp:    SpO2: 98% 97%    Last Pain:  Vitals:   06/10/19 1020  TempSrc:   PainSc: 3                  Lidia Collum

## 2019-06-10 NOTE — Transfer of Care (Signed)
Immediate Anesthesia Transfer of Care Note  Patient: Shantoria Ellwood  Procedure(s) Performed: COLONOSCOPY WITH PROPOFOL (N/A ) ESOPHAGOGASTRODUODENOSCOPY (EGD) WITH PROPOFOL (N/A ) BIOPSY POLYPECTOMY  Patient Location: PACU and Endoscopy Unit  Anesthesia Type:MAC  Level of Consciousness: awake, alert , oriented and patient cooperative  Airway & Oxygen Therapy: Patient Spontanous Breathing and Patient connected to face mask oxygen  Post-op Assessment: Report given to RN, Post -op Vital signs reviewed and stable and Patient moving all extremities  Post vital signs: Reviewed and stable  Last Vitals:  Vitals Value Taken Time  BP    Temp    Pulse    Resp    SpO2      Last Pain:  Vitals:   06/10/19 0817  TempSrc: Oral  PainSc: 0-No pain         Complications: No apparent anesthesia complications

## 2019-06-10 NOTE — Discharge Instructions (Signed)
YOU HAD AN ENDOSCOPIC PROCEDURE TODAY: Refer to the procedure report and other information in the discharge instructions given to you for any specific questions about what was found during the examination. If this information does not answer your questions, please call [facility] office at [phone] to clarify.  ° °YOU SHOULD EXPECT: Some feelings of bloating in the abdomen. Passage of more gas than usual. Walking can help get rid of the air that was put into your GI tract during the procedure and reduce the bloating. If you had a lower endoscopy (such as a colonoscopy or flexible sigmoidoscopy) you may notice spotting of blood in your stool or on the toilet paper. Some abdominal soreness may be present for a day or two, also. ° °DIET: Your first meal following the procedure should be a light meal and then it is ok to progress to your normal diet. A half-sandwich or bowl of soup is an example of a good first meal. Heavy or fried foods are harder to digest and may make you feel nauseous or bloated. Drink plenty of fluids but you should avoid alcoholic beverages for 24 hours. If you had a esophageal dilation, please see attached instructions for diet.   ° °ACTIVITY: Your care partner should take you home directly after the procedure. You should plan to take it easy, moving slowly for the rest of the day. You can resume normal activity the day after the procedure however YOU SHOULD NOT DRIVE, use power tools, machinery or perform tasks that involve climbing or major physical exertion for 24 hours (because of the sedation medicines used during the test).  ° °SYMPTOMS TO REPORT IMMEDIATELY: °A gastroenterologist can be reached at any hour. Please call [phone]  for any of the following symptoms:  °Following lower endoscopy (colonoscopy, flexible sigmoidoscopy) °Excessive amounts of blood in the stool  °Significant tenderness, worsening of abdominal pains  °Swelling of the abdomen that is new, acute  °Fever of 100° or  higher  °Following upper endoscopy (EGD, EUS, ERCP, esophageal dilation) °Vomiting of blood or coffee ground material  °New, significant abdominal pain  °New, significant chest pain or pain under the shoulder blades  °Painful or persistently difficult swallowing  °New shortness of breath  °Black, tarry-looking or red, bloody stools ° °FOLLOW UP:  °If any biopsies were taken you will be contacted by phone or by letter within the next 1-3 weeks. Call [phone]  if you have not heard about the biopsies in 3 weeks.  °Please also call with any specific questions about appointments or follow up tests. ° °

## 2019-06-10 NOTE — Op Note (Signed)
[facility] Patient Name: Sherry Middleton Procedure Date: 06/10/2019 MRN: [id number] Attending MD: Carlota Raspberry. Havery Moros , MD Date of Birth: [birth year] CSN: [id number] Age: 53 Admit Type: Outpatient Procedure:                Colonoscopy Indications:              This is the patient's first colonoscopy, Change in                            bowel habits Providers:                Remo Lipps P. Havery Moros, MD, Josie Dixon, RN,                            Tory Emerald, RN, Lina Sar, Technician, Carleene Cooper, CRNA Referring MD:              Medicines:                Monitored Anesthesia Care Complications:            No immediate complications. Estimated blood loss:                            Minimal. Estimated Blood Loss:     Estimated blood loss was minimal. Procedure:                Pre-Anesthesia Assessment:                           - Prior to the procedure, a History and Physical                            was performed, and patient medications and                            allergies were reviewed. The patient's tolerance of                            previous anesthesia was also reviewed. The risks                            and benefits of the procedure and the sedation                            options and risks were discussed with the patient.                            All questions were answered, and informed consent                            was obtained. Prior Anticoagulants: The patient has                            taken no previous anticoagulant  or antiplatelet                            agents. ASA Grade Assessment: III - A patient with                            severe systemic disease. After reviewing the risks                            and benefits, the patient was deemed in                            satisfactory condition to undergo the procedure.                           After obtaining informed consent, the  colonoscope                            was passed under direct vision. Throughout the                            procedure, the patient's blood pressure, pulse, and                            oxygen saturations were monitored continuously. The                            CF-HQ190L ([id number]) Olympus colonoscope was                            introduced through the anus and advanced to the the                            terminal ileum, with identification of the                            appendiceal orifice and IC valve. The colonoscopy                            was performed without difficulty. The patient                            tolerated the procedure well. The quality of the                            bowel preparation was good. The terminal ileum,                            ileocecal valve, appendiceal orifice, and rectum                            were photographed. Scope In: 9:30:33 AM Scope Out: 9:53:26 AM Scope Withdrawal Time: 0 hours 15 minutes 7 seconds  Total Procedure Duration: 0 hours 22 minutes 53 seconds  Findings:      Skin tags were found on perianal exam.      The terminal ileum appeared normal.      Multiple small-mouthed diverticula were found in the sigmoid colon and       cecum (one).      A 3 mm polyp was found in the sigmoid colon. The polyp was sessile. The       polyp was removed with a cold biopsy forceps. Resection and retrieval       were complete.      Internal hemorrhoids were found during retroflexion.      The exam was otherwise without abnormality. No overt inflammatory       changes.      Biopsies for histology were taken with a cold forceps from the right       colon, left colon and transverse colon for evaluation of microscopic       colitis. Impression:               - Perianal skin tags found on perianal exam.                           - The examined portion of the ileum was normal.                           - Diverticulosis in the sigmoid  colon and in the                            cecum.                           - One 3 mm polyp in the sigmoid colon, removed with                            a cold biopsy forceps. Resected and retrieved.                           - Internal hemorrhoids.                           - The examination was otherwise normal.                           - Biopsies were taken with a cold forceps from the                            right colon, left colon and transverse colon for                            evaluation of microscopic colitis. Moderate Sedation:      No moderate sedation, case performed with MAC Recommendation:           - Patient has a contact number available for                            emergencies. The signs and symptoms of potential  delayed complications were discussed with the                            patient. Return to normal activities tomorrow.                            Written discharge instructions were provided to the                            patient.                           - Resume previous diet.                           - Continue present medications.                           - Await pathology results with further                            recommendations. Procedure Code(s):        --- Professional ---                           [phone], Colonoscopy, flexible; with biopsy, single                            or multiple Diagnosis Code(s):        --- Professional ---                           K64.8, Other hemorrhoids                           K63.5, Polyp of colon                           K64.4, Residual hemorrhoidal skin tags                           R19.4, Change in bowel habit                           K57.30, Diverticulosis of large intestine without                            perforation or abscess without bleeding CPT copyright 2019 American [employer] Association. All rights reserved. The codes documented in this report are  preliminary and upon coder review may  be revised to meet current compliance requirements. Remo Lipps P. Wilberta Dorvil, MD 06/10/2019 10:02:57 AM This report has been signed electronically. Number of Addenda: 0

## 2019-06-10 NOTE — Anesthesia Preprocedure Evaluation (Signed)
Anesthesia Evaluation  Patient identified by MRN, date of birth, ID band Patient awake    Reviewed: Allergy & Precautions, NPO status , Patient's Chart, lab work & pertinent test results  History of Anesthesia Complications (+) PONVNegative for: history of anesthetic complications  Airway Mallampati: I  TM Distance: >3 FB Neck ROM: Full    Dental  (+) Teeth Intact   Pulmonary neg pulmonary ROS,    Pulmonary exam normal        Cardiovascular hypertension, Pt. on medications Normal cardiovascular exam     Neuro/Psych PSYCHIATRIC DISORDERS Anxiety Depression negative neurological ROS     GI/Hepatic Neg liver ROS, PUD, GERD  Medicated,  Endo/Other  Morbid obesity  Renal/GU negative Renal ROS  negative genitourinary   Musculoskeletal negative musculoskeletal ROS (+)   Abdominal (+) + obese,   Peds  Hematology  (+) anemia ,   Anesthesia Other Findings   Reproductive/Obstetrics                             Anesthesia Physical Anesthesia Plan  ASA: III  Anesthesia Plan: MAC   Post-op Pain Management:    Induction: Intravenous  PONV Risk Score and Plan: 3 and Propofol infusion, TIVA and Treatment may vary due to age or medical condition  Airway Management Planned: Natural Airway, Nasal Cannula and Simple Face Mask  Additional Equipment: None  Intra-op Plan:   Post-operative Plan:   Informed Consent: I have reviewed the patients History and Physical, chart, labs and discussed the procedure including the risks, benefits and alternatives for the proposed anesthesia with the patient or authorized representative who has indicated his/her understanding and acceptance.       Plan Discussed with:   Anesthesia Plan Comments:         Anesthesia Quick Evaluation

## 2019-06-10 NOTE — Interval H&P Note (Signed)
History and Physical Interval Note:  06/10/2019 8:57 AM  Sherry Middleton  has presented today for surgery, with the diagnosis of IBS, Diarrhea, abd pain nausea.  The various methods of treatment have been discussed with the patient and family. After consideration of risks, benefits and other options for treatment, the patient has consented to  Procedure(s): COLONOSCOPY WITH PROPOFOL (N/A) ESOPHAGOGASTRODUODENOSCOPY (EGD) WITH PROPOFOL (N/A) as a surgical intervention.  The patient's history has been reviewed, patient examined, no change in status, stable for surgery.  I have reviewed the patient's chart and labs.  Questions were answered to the patient's satisfaction.     [place]

## 2019-06-10 NOTE — Op Note (Addendum)
[facility] Patient Name: Sherry Middleton Procedure Date: 06/10/2019 MRN: [id number] Attending MD: Carlota Raspberry. Havery Moros , MD Date of Birth: Oct 09, 1965 CSN: [id number] Age: 53 Admit Type: Outpatient Procedure:                Upper GI endoscopy Indications:              Epigastric abdominal pain, Dysphagia, altered bowel                            habits, on omeprazole, dysphagia improved / minimal Providers:                Carlota Raspberry. Havery Moros, MD, Josie Dixon, RN,                            Elmer Ramp. Tilden Dome, RN, Lina Sar, Technician,                            [place]sor Armistead, CRNA Referring MD:              Medicines:                Monitored Anesthesia Care Complications:            No immediate complications. Estimated blood loss:                            Minimal. Estimated Blood Loss:     Estimated blood loss was minimal. Procedure:                Pre-Anesthesia Assessment:                           - Prior to the procedure, a History and Physical                            was performed, and patient medications and                            allergies were reviewed. The patient's tolerance of                            previous anesthesia was also reviewed. The risks                            and benefits of the procedure and the sedation                            options and risks were discussed with the patient.                            All questions were answered, and informed consent                            was obtained. Prior Anticoagulants: The patient has  taken no previous anticoagulant or antiplatelet                            agents. ASA Grade Assessment: III - A patient with                            severe systemic disease. After reviewing the risks                            and benefits, the patient was deemed in                            satisfactory condition to undergo the procedure.                After obtaining informed consent, the endoscope was                            passed under direct vision. Throughout the                            procedure, the patient's blood pressure, pulse, and                            oxygen saturations were monitored continuously. The                            GIF-H190 ([id number]) Olympus gastroscope was                            introduced through the mouth, and advanced to the                            second part of duodenum. The upper GI endoscopy was                            accomplished without difficulty. The patient                            tolerated the procedure well. Scope In: Scope Out: Findings:      Esophagogastric landmarks were identified: the Z-line was found at 40       cm, the gastroesophageal junction was found at 40 cm and the upper       extent of the gastric folds was found at 40 cm from the incisors.      There was a benign small gastric inlet patch in the proximal esophagus.       The exam of the esophagus was otherwise normal. No stricture / stenosis,       no Barrett's esophagus or esophagitis.      Biopsies were taken with a cold forceps in the upper third of the       esophagus, in the middle third of the esophagus and in the lower third       of the esophagus for histology.      The entire examined stomach was normal. Biopsies were taken with a cold  forceps for Helicobacter pylori testing.      The duodenal bulb and second portion of the duodenum were normal.       Biopsies for histology were taken with a cold forceps for evaluation of       celiac disease. Impression:               - Esophagogastric landmarks identified.                           - Benign proximal esophageal small gastric inlet                            patch.                           - Normal esophagus otherwise. Biopsies obtained to                            rule out eosinophilic esophagitis.                            - Normal stomach. Biopsied to rule out H pylori                           - Normal duodenal bulb and second portion of the                            duodenum. Biopsied to rule out celiac disease. . Moderate Sedation:      No moderate sedation, case performed with MAC Recommendation:           - Patient has a contact number available for                            emergencies. The signs and symptoms of potential                            delayed complications were discussed with the                            patient. Return to normal activities tomorrow.                            Written discharge instructions were provided to the                            patient.                           - Resume previous diet.                           - Continue present medications.                           - Await pathology results with further  recommendations. Procedure Code(s):        --- Professional ---                           [id number], Esophagogastroduodenoscopy, flexible,                            transoral; with biopsy, single or multiple Diagnosis Code(s):        --- Professional ---                           R10.13, Epigastric pain                           R13.10, Dysphagia, unspecified CPT copyright 2019 [employer] Medical Association. All rights reserved. The codes documented in this report are preliminary and upon coder review may  be revised to meet current compliance requirements. Remo Lipps P. , MD 06/10/2019 9:58:34 AM This report has been signed electronically. Number of Addenda: 0

## 2019-06-11 ENCOUNTER — Telehealth: Payer: Self-pay | Admitting: Gastroenterology

## 2019-06-11 ENCOUNTER — Other Ambulatory Visit: Payer: Self-pay

## 2019-06-11 ENCOUNTER — Encounter (HOSPITAL_COMMUNITY): Payer: Self-pay | Admitting: Gastroenterology

## 2019-06-11 ENCOUNTER — Ambulatory Visit (INDEPENDENT_AMBULATORY_CARE_PROVIDER_SITE_OTHER): Payer: BC Managed Care – PPO | Admitting: Psychiatry

## 2019-06-11 DIAGNOSIS — F411 Generalized anxiety disorder: Secondary | ICD-10-CM | POA: Diagnosis not present

## 2019-06-11 DIAGNOSIS — R238 Other skin changes: Secondary | ICD-10-CM | POA: Diagnosis not present

## 2019-06-11 LAB — SURGICAL PATHOLOGY

## 2019-06-11 NOTE — Telephone Encounter (Signed)
Called patient back, she had just had an EGD/Colonoscopy yesterday and was up doing heavy housework this morning. I encouraged her to have a lazy day today and let her body recover from her procedures yesterday. She also had not taken her regular meds this am. Asked her to go back on her regular medication regimen and hydrate well. She agreed to do this

## 2019-06-11 NOTE — Progress Notes (Signed)
[facility] Counselor/Therapist Progress Note  Patient ID: Sherry Middleton, MRN: [id number],    Date: 06/15/2019  Time Spent: 52 minutes start time 5:03 PM end time 5:55 PM Virtual Visit via Telephone Note Connected with patient by a video enabled telemedicine/telehealth application, with their informed consent, and verified patient privacy and that I am speaking with the correct person using two identifiers. I discussed the limitations, risks, security and privacy concerns of performing psychotherapy and management service by telephone and the availability of in person appointments. I also discussed with the patient that there may be a patient responsible charge related to this service. The patient expressed understanding and agreed to proceed. I discussed the treatment planning with the patient. The patient was provided an opportunity to ask questions and all were answered. The patient agreed with the plan and demonstrated an understanding of the instructions. The patient was advised to call  our office if  symptoms worsen or feel they are in a crisis state and need immediate contact.   Therapist Location: Office Patient Location: home    Treatment Type: Individual Therapy  Reported Symptoms: Anxiety, panic, crying spells, sadness, pain issues, stomach issues, sleep issues  Mental Status Exam:  Appearance:   Casual     Behavior:  Appropriate  Motor:  Normal  Speech/Language:   Normal Rate  Affect:  Appropriate  Mood:  anxious and sad  Thought process:  normal  Thought content:    WNL  Sensory/Perceptual disturbances:    WNL  Orientation:  oriented to person, place, time/date and situation  Attention:  Good  Concentration:  Good  Memory:  WNL  Fund of knowledge:   Good  Insight:    Good  Judgment:   Good  Impulse Control:  Good   Risk Assessment: Danger to Self:  No Self-injurious Behavior: No Danger to Others: No Duty to Warn:no Physical Aggression /  Violence:No  Access to Firearms a concern: No  Gang Involvement:No   Subjective: Met with patient via virtual session.  Patient reported she was feeling too sick to be able to come to session.  Patient went on to explain she had had her upper and lower GI and nothing conclusive had been told her at this point.  Patient stated she is feeling overwhelmed with financial issues and feels she needs to go back to work even though she does not know how she will since she starts crying as soon as she thinks about it.  Patient is also continuing to have lots of symptoms that would make it difficult for her to work.  Patient was encouraged to think through anything that may be triggering her concerning with work.  She did E MDR set on a time when she caught her coworker talking about her.  Suds level 10, negative cognition "I am not good enough" felt hurt and rejection in her stomach.  Patient was able to reduce level suds level to 2.  Discussed the importance of her using her positive self talk to help remind herself that she is important and she does matter.  Patient was also encouraged to continue working on grounding exercises and taking time each day to get herself to a better place before starting her day.  Interventions: Solution-Oriented/Positive Psychology and Eye Movement Desensitization and Reprocessing (EMDR)  Diagnosis:   ICD-10-CM   1. Generalized anxiety disorder  F41.1     Plan: Patient is to utilize CBT skills and coping skills to decrease anxiety.  Patient  is to work on reminding herself regularly that she is important.  She is to use box theory and regular affirmations to help manage emotions appropriately.  Long-term goal: Resolve the core conflict that is the source of anxiety Short-term goal: Identified the major life conflicts from past and present in the form the basis for present anxiety Increase understanding of beliefs and messages that produce the worry and anxiety  Sherry Middleton,  [facility]

## 2019-06-12 ENCOUNTER — Other Ambulatory Visit: Payer: Self-pay

## 2019-06-12 MED ORDER — PANTOPRAZOLE SODIUM 40 MG PO TBEC: 40.0000 mg | DELAYED_RELEASE_TABLET | Freq: Two times a day (BID) | ORAL | 3 refills | Status: DC

## 2019-06-12 MED ORDER — ONDANSETRON 4 MG PO TBDP: 4.0000 mg | ORAL_TABLET | Freq: Four times a day (QID) | ORAL | 1 refills | Status: DC | PRN

## 2019-06-12 MED ORDER — COLESTIPOL HCL 1 G PO TABS: 1.0000 g | ORAL_TABLET | Freq: Two times a day (BID) | ORAL | 1 refills | Status: DC

## 2019-06-17 ENCOUNTER — Other Ambulatory Visit: Payer: Self-pay

## 2019-06-17 ENCOUNTER — Ambulatory Visit (INDEPENDENT_AMBULATORY_CARE_PROVIDER_SITE_OTHER): Payer: BC Managed Care – PPO | Admitting: Psychiatry

## 2019-06-17 DIAGNOSIS — F411 Generalized anxiety disorder: Secondary | ICD-10-CM | POA: Diagnosis not present

## 2019-06-17 NOTE — Progress Notes (Signed)
[facility] Counselor/Therapist Progress Note  Patient ID: Sherry Middleton, MRN: [id number],    Date: 06/17/2019  Time Spent: 50 minutes start time 5:06 PM end time 5 :70 PM Virtual Visit via Telephone Note Connected with patient by a video enabled telemedicine/telehealth application, with their informed consent, and verified patient privacy and that I am speaking with the correct person using two identifiers. I discussed the limitations, risks, security and privacy concerns of performing psychotherapy and management service by telephone and the availability of in person appointments. I also discussed with the patient that there may be a patient responsible charge related to this service. The patient expressed understanding and agreed to proceed. I discussed the treatment planning with the patient. The patient was provided an opportunity to ask questions and all were answered. The patient agreed with the plan and demonstrated an understanding of the instructions. The patient was advised to call  our office if  symptoms worsen or feel they are in a crisis state and need immediate contact.   Therapist Location: office Patient Location: home    Treatment Type: Individual Therapy  Reported Symptoms: anxiety, panic attack, triggered responses, sleep issues, sadness, crying spells, stomach issues  Mental Status Exam:  Appearance:   Casual     Behavior:  Appropriate  Motor:  Normal  Speech/Language:   Normal Rate  Affect:  Appropriate  Mood:  anxious and sad  Thought process:  normal  Thought content:    WNL  Sensory/Perceptual disturbances:    WNL  Orientation:  oriented to person, place, time/date and situation  Attention:  Good  Concentration:  Good  Memory:  Immediate;   [place] of knowledge:   Good  Insight:    Good  Judgment:   Good  Impulse Control:  Good   Risk Assessment: Danger to Self:  No Self-injurious Behavior: No Danger to Others: No Duty to  Warn:no Physical Aggression / Violence:No  Access to Firearms a concern: No  Gang Involvement:No   Subjective: Met with patient via virtual session through [employer].  Patient had gotten her days confused and did not show for session.  Contacted patient due to possible confusion to see if she was expecting a telehealth session.  She shared what happened and asked if it could be set up for her.  She shared she had gotten a call earlier in the day that her favorite uncle had a stroke and they were trying to move him to a hospital that could remove a blood clot in his brain.  Patient explained she was very worried about him and was waiting to here back from her aunt to find out if he is okay.  She stated she is working on skills from session and starting to feel a decrease in negative emotions as she works on her positive affirmations.  Patient explained she is still struggling with multiple issues.  She shared that she needs a medication for her stomach that she cannot afford and she is not sure what she will do about that.  Encourage patient to try and just take things 1 step at a time and to try and focus on the things that she can control fix and change.  Also encouraged her to talk to the physician's office about the situation.  Patient was reminded of her coping skills that she can use to help herself stay at a good place even in the hard situations.  During session patient's aunt called and reported her  uncle is doing some better but there is still lots of issues.  Patient was encouraged to remind herself there is nothing she can do concerning the situation and she could not go to the hospital at this point to be with him even if she wanted to.  She was encouraged to remind herself of the truth and stay focused on what she can do for him rather than worrying about the things she cannot.  Interventions: Cognitive Behavioral Therapy and Solution-Oriented/Positive Psychology  Diagnosis:   ICD-10-CM   1.  Generalized anxiety disorder  F41.1     Plan: Patient is to utilize CBT and coping skills to decrease her anxiety and be able to keep herself grounded even in very difficult situations.  Patient is also to work on her exercise and journaling.  She is also to take time in the morning just to relax and get herself grounded before she starts her day. Long-term goal: Resolve the core conflict that is the source of anxiety Short-term goal: Identify the major life complex from the past and present that form the basis for present anxiety Increase understanding of beliefs and messages that produce the worry and anxiety  [name], [facility]

## 2019-06-18 DIAGNOSIS — K589 Irritable bowel syndrome without diarrhea: Secondary | ICD-10-CM | POA: Diagnosis not present

## 2019-06-22 ENCOUNTER — Encounter: Payer: Self-pay | Admitting: Psychiatry

## 2019-06-25 ENCOUNTER — Ambulatory Visit: Payer: BC Managed Care – PPO | Admitting: Psychiatry

## 2019-06-26 ENCOUNTER — Encounter: Payer: Self-pay | Admitting: Adult Health

## 2019-06-26 ENCOUNTER — Ambulatory Visit (INDEPENDENT_AMBULATORY_CARE_PROVIDER_SITE_OTHER): Payer: BC Managed Care – PPO | Admitting: Adult Health

## 2019-06-26 ENCOUNTER — Other Ambulatory Visit: Payer: Self-pay

## 2019-06-26 DIAGNOSIS — G4700 Insomnia, unspecified: Secondary | ICD-10-CM

## 2019-06-26 DIAGNOSIS — F411 Generalized anxiety disorder: Secondary | ICD-10-CM

## 2019-06-26 DIAGNOSIS — F410 Panic disorder [episodic paroxysmal anxiety] without agoraphobia: Secondary | ICD-10-CM | POA: Diagnosis not present

## 2019-06-26 DIAGNOSIS — F329 Major depressive disorder, single episode, unspecified: Secondary | ICD-10-CM

## 2019-06-26 DIAGNOSIS — F32A Depression, unspecified: Secondary | ICD-10-CM
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
## 2019-06-27 DIAGNOSIS — Z0289 Encounter for other administrative examinations: Secondary | ICD-10-CM

## 2019-07-01 ENCOUNTER — Ambulatory Visit (INDEPENDENT_AMBULATORY_CARE_PROVIDER_SITE_OTHER): Payer: BC Managed Care – PPO | Admitting: Psychiatry

## 2019-07-01 ENCOUNTER — Other Ambulatory Visit: Payer: Self-pay

## 2019-07-01 DIAGNOSIS — F411 Generalized anxiety disorder: Secondary | ICD-10-CM

## 2019-07-01 NOTE — Progress Notes (Signed)
[facility] Counselor/Therapist Progress Note  Patient ID: Sherry Middleton, MRN: [id number],    Date: 07/01/2019  Time Spent: 48 minutes start time 5:10 PM end time 5:58 PM  Treatment Type: Individual Therapy  Reported Symptoms: anxiety, sadness, low motivation, fatigue, focusing issues, panic  Mental Status Exam:  Appearance:   Casual     Behavior:  Appropriate  Motor:  Normal  Speech/Language:   Normal Rate  Affect:  Appropriate  Mood:  anxious  Thought process:  normal  Thought content:    racing thoughts  Sensory/Perceptual disturbances:    WNL  Orientation:  oriented to person, place, time/date and situation  Attention:  Fair  Concentration:  Fair  Memory:  Immediate;   [place] of knowledge:   Fair  Insight:    Fair  Judgment:   Fair  Impulse Control:  Good   Risk Assessment: Danger to Self:  No Self-injurious Behavior: No Danger to Others: No Duty to Warn:no Physical Aggression / Violence:No  Access to Firearms a concern: No  Gang Involvement:No   Subjective: Patient was present for session.  She shared that she was feeling very anxious and overwhelmed. She explained she is very stressed with the election and increase in COVID cases.  She shared she felt she almost had a panic attack after hearing news about the election.  Currently her uncle is doing okay and that has been positive for her.  She reported her husband on the other hand is having issues with his health and she is very worried because she is not sure she knows what she would do if she did not have him.  Patient also fell and hurt her knee but is trying to work through it this time.  Patient stated she is working on utilizing her coping skills and when she puts them into practice she does find them to be helpful.  She shared it is easy for those automatic negative thoughts to surface and start cycling.  Especially when she thinks about finances and the anxiety surfaces.  She went on to share she  recognizes she cannot focus and her memory is not solid so she cannot work at this time but that still very hard for her with the situation.  Was encouraged to recognize that at this point she needs to focus on taking care of herself and doing the things that help keep her calmer.  Patient was encouraged to think about what the automatic negative thought is that seems to be surfacing and triggering her anxiety.  She identified the thought that someone is going to harm her is what seems to be cycling for her.  Patient was encouraged to think about what she needs to be telling herself regularly rather than allowing those thoughts to have energy.  Patient was reminded of CBT ST OPP technique to utilize as soon as she feels her thoughts are cycling in a negative direction.  Patient was also encouraged to keep her brain engaged in positive activities including journaling.  Patient agreed to try and work on plans developed in session.  Interventions: Cognitive Behavioral Therapy and Solution-Oriented/Positive Psychology  Diagnosis:   ICD-10-CM   1. Generalized anxiety disorder  F41.1     Plan: She is to utilize CBT and coping skills to decrease anxiety levels and panic.  Patient is also to work on reminding herself of the facts and keeping her brain engaged in positive activities and thoughts.  Patient is to remind herself  regularly that she is safe and that her husband and children are safe as well.   Long-term goal: Resolve the core conflict that is the source of anxiety Short-term goal: Identify the major life complex from past and present the form the basis for present anxiety Increase understanding of beliefs and messages that produce worry and anxiety [name], [facility]

## 2019-07-03 ENCOUNTER — Encounter: Payer: Self-pay | Admitting: Psychiatry

## 2019-07-09 ENCOUNTER — Ambulatory Visit (INDEPENDENT_AMBULATORY_CARE_PROVIDER_SITE_OTHER): Payer: BC Managed Care – PPO | Admitting: Psychiatry

## 2019-07-09 DIAGNOSIS — F411 Generalized anxiety disorder: Secondary | ICD-10-CM | POA: Diagnosis not present
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
## 2019-07-16 ENCOUNTER — Ambulatory Visit: Payer: BC Managed Care – PPO | Admitting: Psychiatry

## 2019-07-16 ENCOUNTER — Ambulatory Visit: Payer: BC Managed Care – PPO | Admitting: Gastroenterology

## 2019-07-23 ENCOUNTER — Ambulatory Visit (INDEPENDENT_AMBULATORY_CARE_PROVIDER_SITE_OTHER): Payer: Self-pay | Admitting: Psychiatry

## 2019-07-23 ENCOUNTER — Other Ambulatory Visit: Payer: Self-pay

## 2019-07-23 DIAGNOSIS — F411 Generalized anxiety disorder: Secondary | ICD-10-CM
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
## 2019-07-28 ENCOUNTER — Encounter: Payer: Self-pay | Admitting: Adult Health

## 2019-07-28 ENCOUNTER — Ambulatory Visit (INDEPENDENT_AMBULATORY_CARE_PROVIDER_SITE_OTHER): Payer: Self-pay | Admitting: Adult Health

## 2019-07-28 ENCOUNTER — Other Ambulatory Visit: Payer: Self-pay

## 2019-07-28 DIAGNOSIS — F411 Generalized anxiety disorder: Secondary | ICD-10-CM

## 2019-07-28 DIAGNOSIS — F410 Panic disorder [episodic paroxysmal anxiety] without agoraphobia: Secondary | ICD-10-CM

## 2019-07-28 DIAGNOSIS — F331 Major depressive disorder, recurrent, moderate: Secondary | ICD-10-CM

## 2019-07-28 DIAGNOSIS — F32A Depression, unspecified: Secondary | ICD-10-CM

## 2019-07-28 DIAGNOSIS — F329 Major depressive disorder, single episode, unspecified: Secondary | ICD-10-CM

## 2019-07-28 DIAGNOSIS — G4700 Insomnia, unspecified: Secondary | ICD-10-CM

## 2019-07-28 MED ORDER — ALPRAZOLAM 1 MG PO TABS: ORAL_TABLET | ORAL | 2 refills | Status: DC

## 2019-07-28 MED ORDER — ESCITALOPRAM OXALATE 20 MG PO TABS: ORAL_TABLET | ORAL | 5 refills | Status: DC
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
## 2019-08-04 DIAGNOSIS — Z0289 Encounter for other administrative examinations: Secondary | ICD-10-CM

## 2019-08-08 ENCOUNTER — Ambulatory Visit (INDEPENDENT_AMBULATORY_CARE_PROVIDER_SITE_OTHER): Payer: Self-pay | Admitting: Psychiatry

## 2019-08-08 ENCOUNTER — Other Ambulatory Visit: Payer: Self-pay

## 2019-08-08 DIAGNOSIS — F411 Generalized anxiety disorder: Secondary | ICD-10-CM
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
## 2019-08-11 ENCOUNTER — Telehealth: Payer: Self-pay | Admitting: Psychiatry

## 2019-08-11 NOTE — Telephone Encounter (Signed)
Sherry Middleton called to report that her is having problems with the Lexapro.  She is not feeling well with it.  Maybe just needs to change dose.  Please call to discuss.  Next appt 09/01/19

## 2019-08-11 NOTE — Telephone Encounter (Signed)
Continue medication as prescribed.

## 2019-08-12 NOTE — Telephone Encounter (Signed)
Patient aware to continue and to call back with worsening symptoms. The only thing she's really concerned about is the flash/spot that she notices with her vision.

## 2019-08-13 ENCOUNTER — Encounter: Payer: Self-pay | Admitting: Psychiatry

## 2019-08-13 ENCOUNTER — Other Ambulatory Visit: Payer: Self-pay

## 2019-08-13 ENCOUNTER — Ambulatory Visit (INDEPENDENT_AMBULATORY_CARE_PROVIDER_SITE_OTHER): Payer: Self-pay | Admitting: Psychiatry

## 2019-08-13 DIAGNOSIS — F411 Generalized anxiety disorder: Secondary | ICD-10-CM
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
## 2019-08-20 ENCOUNTER — Ambulatory Visit (INDEPENDENT_AMBULATORY_CARE_PROVIDER_SITE_OTHER): Payer: Self-pay | Admitting: Psychiatry

## 2019-08-20 DIAGNOSIS — F411 Generalized anxiety disorder: Secondary | ICD-10-CM
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
## 2019-08-21 ENCOUNTER — Encounter: Payer: Self-pay | Admitting: Psychiatry

## 2019-09-01 ENCOUNTER — Encounter: Payer: Self-pay | Admitting: Adult Health

## 2019-09-01 ENCOUNTER — Ambulatory Visit (INDEPENDENT_AMBULATORY_CARE_PROVIDER_SITE_OTHER): Payer: Self-pay | Admitting: Adult Health

## 2019-09-01 ENCOUNTER — Ambulatory Visit (INDEPENDENT_AMBULATORY_CARE_PROVIDER_SITE_OTHER): Payer: Self-pay | Admitting: Psychiatry

## 2019-09-01 ENCOUNTER — Encounter: Payer: Self-pay | Admitting: Psychiatry

## 2019-09-01 DIAGNOSIS — G4700 Insomnia, unspecified: Secondary | ICD-10-CM

## 2019-09-01 DIAGNOSIS — F429 Obsessive-compulsive disorder, unspecified: Secondary | ICD-10-CM

## 2019-09-01 DIAGNOSIS — F331 Major depressive disorder, recurrent, moderate: Secondary | ICD-10-CM

## 2019-09-01 DIAGNOSIS — F410 Panic disorder [episodic paroxysmal anxiety] without agoraphobia: Secondary | ICD-10-CM

## 2019-09-01 DIAGNOSIS — F411 Generalized anxiety disorder: Secondary | ICD-10-CM

## 2019-09-01 DIAGNOSIS — F909 Attention-deficit hyperactivity disorder, unspecified type: Secondary | ICD-10-CM

## 2019-09-01 MED ORDER — ESCITALOPRAM OXALATE 20 MG PO TABS: ORAL_TABLET | ORAL | 5 refills | Status: DC
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
## 2019-09-01 NOTE — Progress Notes (Signed)
Sherry Middleton [id number] 11-18-1965 53 y.o.  Virtual Visit via Telephone Note  I connected with pt on 09/01/19 at 10:20 AM EST by telephone and verified that I am speaking with the correct person using two identifiers.   I discussed the limitations, risks, security and privacy concerns of performing an evaluation and management service by telephone and the availability of in person appointments. I also discussed with the patient that there may be a patient responsible charge related to this service. The patient expressed understanding and agreed to proceed.   I discussed the assessment and treatment plan with the patient. The patient was provided an opportunity to ask questions and all were answered. The patient agreed with the plan and demonstrated an understanding of the instructions.   The patient was advised to call back or seek an in-person evaluation if the symptoms worsen or if the condition fails to improve as anticipated.  I provided 30 minutes of non-face-to-face time during this encounter.  The patient was located at home.  The provider was located at [place].   Aloha Gell, NP   Subjective:   Patient ID:  Sherry Middleton is a 53 y.o. (DOB [birth year]) female.  Chief Complaint:  Chief Complaint  Patient presents with  . Anxiety  . Depression  . Insomnia  . Other    OCD  . Panic Attack  . ADHD    HPI Sherry Middleton presents for follow-up of depression, anxiety, panic attacks, OCD, ADHD, and insomnia.   Describes mood today as "not too good". Tearful. Mood symptoms - reports depression, anxiety, and irritability. Stating "'I'm really trying to get back to me". Gets overwhelmed easily - "I just shut down". Has lost her job - working with disability. Does not feel like she can return to work at this time. She and husband stayed home over Christmas - "I thought it would be best for [place]". She did get to see grandchildren over "video". Her son  and his family have had Covid-19 within the family. Brother in law passed away of Covid-19 yesterday. He and her sister have both been hospitalized. Sister remains in hospital and is slow to recover. Having IBS flare-ups. Has an appointment with GI doctor in January. Having to go to the "bathroom" anytime she eats. Reports panic attacks - earlier in December 2 to 3 times a week in the evenings when it got dark. Over past few weeks - "random". Has interest but lacks the "follow through" to complete tasks. Has things in her mind that she "desperately" needs to do the can't do it. Has no energy to follow through - "I'm just lifeless". Had to push herself to mop the bathroom floor. Cooks "occasionally". Sleeping 12 hours at a time. Having a hard time finding words or remembering things. Is working with "tapping" per her therapist - "that has helped". Felt lightheaded and dizzy yesterday - "may be dehydrated". Sees therapist weekly. Varying interest and motivation. Taking medications as prescribed. Energy levels low. Active, does not have a regular exercise routine. Enjoys some usual interests and activities. Married. Lives with husband. Spending time with family - 2 sons and their family (phone and video). Mostly staying home.  Appetite adequate. Weight gain Sleeps well most nights. Averages 8 to 10 hours. Having "crazy" dreams.  Focus and concentration difficulties - stating "If I don't do everything perfectly, I feel like a failure". Completing tasks. Managing some aspects of household. Out of work currently. Denies SI or HI. Denies AH or  VH.  Review of Systems:  Review of Systems  Musculoskeletal: Negative for gait problem.  Neurological: Negative for tremors.  Psychiatric/Behavioral:       Please refer to HPI    Medications: I have reviewed the patient's current medications.  Current Outpatient Medications  Medication Sig Dispense Refill  . ALPRAZolam (XANAX) 1 MG tablet Take 1 tablet three times  daily prn anxiety 90 tablet 2  . colestipol (COLESTID) 1 g tablet Take 1 tablet (1 g total) by mouth 2 (two) times daily. 60 tablet 1  . DUEXIS 800-26.6 MG TABS Take 1 tablet by mouth every 8 (eight) hours as needed (Pain).     [name] escitalopram (LEXAPRO) 20 MG tablet Take one tablet daily. 30 tablet 5  . furosemide (LASIX) 20 MG tablet Take 20 mg by mouth daily.     [name] lisinopril (ZESTRIL) 20 MG tablet Take 20 mg by mouth daily.     [name] lisinopril-hydrochlorothiazide (PRINZIDE,ZESTORETIC) 20-12.5 MG per tablet Take 1 tablet by mouth daily.     . metoCLOPramide (REGLAN) 10 MG tablet Take 1 tablet by mouth 30 min before drinking prep. 2 tablet 0  . omeprazole (PRILOSEC) 40 MG capsule Take 1 capsule (40 mg total) by mouth 2 (two) times daily. 90 capsule 3  . ondansetron (ZOFRAN ODT) 4 MG disintegrating tablet Take 1 tablet (4 mg total) by mouth every 6 (six) hours as needed for nausea or vomiting. 90 tablet 1  . ondansetron (ZOFRAN) 4 MG tablet Take 4 mg by mouth as needed (nausea).     . pantoprazole (PROTONIX) 40 MG tablet Take 1 tablet (40 mg total) by mouth 2 (two) times daily. 60 tablet 3  . polyethylene glycol powder (GLYCOLAX/MIRALAX) 17 GM/SCOOP powder Take 0.5 Containers by mouth daily as needed for mild constipation or moderate constipation. As needed    . Probiotic Product (ALIGN) 4 MG CAPS Take 4 mg by mouth daily.    . promethazine (PHENERGAN) 25 MG tablet Take 25 mg by mouth every 6 (six) hours as needed for nausea or vomiting.    . Scar GEL Apply 1 application topically daily as needed (Scar). As needed    . spironolactone (ALDACTONE) 25 MG tablet Take 25 mg by mouth daily.     [place] Gunning BOWEL PREP KIT 17.5-3.13-1.6 GM/177ML SOLN Take 1 kit by mouth as directed. For colonoscopy prep 354 mL 0  . Vitamin D, Ergocalciferol, (DRISDOL) 1.25 MG (50000 UT) CAPS capsule Take 50,000 Units by mouth 2 (two) times a week.     . Wheat Dextrin (BENEFIBER DRINK MIX PO) Take 1 Package by mouth daily as  needed (constipation).     No current facility-administered medications for this visit.    Medication Side Effects: None  Allergies:  Allergies  Allergen Reactions  . Levaquin [Levofloxacin In D5w] Shortness Of Breath    avalox and cipro   . Other Other (See Comments)    Steroids Hyper sensitive/palpitations  . Asa [Aspirin] Other (See Comments)    Ulcers  . Ibuprofen Other (See Comments)    Ulcers  . Sulfonamide Derivatives Other (See Comments)    Finger nails turn blue    Past Medical History:  Diagnosis Date  . Anal fissure   . Anemia   . Anxiety   . Depression   . GERD (gastroesophageal reflux disease)   . History of weight change   . Hypertension   . Hypertension   . Palpitations   . Peptic ulcer disease   .  Scar   . Swelling     Family History  Problem Relation Age of Onset  . Alcoholism Father   . Esophageal cancer Father   . Heart disease Mother   . Kidney disease Mother   . Breast cancer Sister        1/2 sister  . Diabetes Paternal Grandmother   . Heart disease Paternal Grandmother   . Diabetes Paternal Grandfather   . Crohn's disease Cousin   . Crohn's disease Niece   . Colon cancer Neg Hx   . Pancreatic cancer Neg Hx   . Stomach cancer Neg Hx     Social History   Socioeconomic History  . Marital status: Married    Spouse name: Not on file  . Number of children: Not on file  . Years of education: Not on file  . Highest education level: Not on file  Occupational History  . Not on file  Tobacco Use  . Smoking status: Never Smoker  . Smokeless tobacco: Never Used  Substance and Sexual Activity  . Alcohol use: No  . Drug use: No  . Sexual activity: Not on file  Other Topics Concern  . Not on file  Social History Narrative  . Not on file   Social Determinants of Health   Financial Resource Strain:   . Difficulty of Paying Living Expenses: Not on file  Food Insecurity:   . Worried About Charity fundraiser in the Last Year: Not  on file  . Ran Out of Food in the Last Year: Not on file  Transportation Needs:   . Lack of Transportation (Medical): Not on file  . Lack of Transportation (Non-Medical): Not on file  Physical Activity:   . Days of Exercise per Week: Not on file  . Minutes of Exercise per Session: Not on file  Stress:   . Feeling of Stress : Not on file  Social Connections:   . Frequency of Communication with Friends and Family: Not on file  . Frequency of Social Gatherings with Friends and Family: Not on file  . Attends Religious Services: Not on file  . Active Member of Clubs or Organizations: Not on file  . Attends Archivist Meetings: Not on file  . Marital Status: Not on file  Intimate Partner Violence:   . Fear of Current or Ex-Partner: Not on file  . Emotionally Abused: Not on file  . Physically Abused: Not on file  . Sexually Abused: Not on file    Past Medical History, Surgical history, Social history, and Family history were reviewed and updated as appropriate.   Please see review of systems for further details on the patient's review from today.   Objective:   Physical Exam:  There were no vitals taken for this visit.  Physical Exam Neurological:     Mental Status: She is alert and oriented to person, place, and time.     Cranial Nerves: No dysarthria.  Psychiatric:        Attention and Perception: Attention and perception normal.        Mood and Affect: Mood is anxious and depressed. Affect is tearful.        Speech: Speech normal.        Behavior: Behavior is cooperative.        Thought Content: Thought content normal. Thought content is not paranoid or delusional. Thought content does not include homicidal or suicidal ideation. Thought content does not include homicidal or suicidal plan.  Cognition and Memory: Cognition and memory normal.        Judgment: Judgment normal.     Comments: Insight intact     Lab Review:     Component Value Date/Time   NA  140 10/14/2007 2047   K 4.0 10/14/2007 2047   CL 104 10/14/2007 2047   CO2 25 10/14/2007 2047   GLUCOSE 96 10/14/2007 2047   BUN 9 10/14/2007 2047   CREATININE 0.68 10/14/2007 2047   CALCIUM 8.8 10/14/2007 2047       Component Value Date/Time   WBC 7.3 10/14/2007 2047   RBC 3.98 10/14/2007 2047   HGB 8.3 (L) 10/14/2007 2047   HCT 30.3 (L) 10/14/2007 2047   PLT 380 10/14/2007 2047   MCV 76.1 (L) 10/14/2007 2047   MCHC 27.4 (L) 10/14/2007 2047   RDW 21.9 (H) 10/14/2007 2047   LYMPHSABS 2.0 10/25/2006 1625   MONOABS 0.5 10/25/2006 1625   EOSABS 0.1 10/25/2006 1625   BASOSABS 0.0 10/25/2006 1625    No results found for: POCLITH, LITHIUM   No results found for: PHENYTOIN, PHENOBARB, VALPROATE, CBMZ   .res Assessment: Plan:    Plan: 1. Continue Lexapro 64m daily as tolerated.  2. Continue Xanax 163mTID  Continue therapy  Will continue disability at this time. Patient unable to return to work. Will continue to increase medications as tolerated. Started therapy 04/29/2019, and will continue to see weekly.   Out of work 09/01/2019 through 09/28/2018. Will review a return to work date at that time.  Discussed potential benefits, risk, and side effects of benzodiazepines to include potential risk of tolerance and dependence, as well as possible drowsiness.  Advised patient not to drive if experiencing drowsiness and to take lowest possible effective dose to minimize risk of dependence and tolerance.  KaAngeletteas seen today for anxiety, depression, insomnia, other, panic attack and adhd.  Diagnoses and all orders for this visit:  Obsessive-compulsive disorder, unspecified type  Major depressive disorder, recurrent episode, moderate (HCC) -     escitalopram (LEXAPRO) 20 MG tablet; Take one tablet daily.  Generalized anxiety disorder -     escitalopram (LEXAPRO) 20 MG tablet; Take one tablet daily.  Panic attacks -     escitalopram (LEXAPRO) 20 MG tablet; Take one tablet  daily.  Attention deficit hyperactivity disorder (ADHD), unspecified ADHD type  Insomnia, unspecified type    Please see After Visit Summary for patient specific instructions.  Future Appointments  Date Time Provider [place]12/28/2020 12:00 PM InLina SayreLCHastings [facility] None  09/08/2019  3:40 PM [place], StCarlota RaspberryMD [facility]I-[facility]    No orders of the defined types were placed in this encounter.     -------------------------------

## 2019-09-08 ENCOUNTER — Ambulatory Visit: Payer: Self-pay | Admitting: Gastroenterology

## 2019-09-12 ENCOUNTER — Ambulatory Visit (INDEPENDENT_AMBULATORY_CARE_PROVIDER_SITE_OTHER): Payer: Self-pay | Admitting: Psychiatry

## 2019-09-12 DIAGNOSIS — F411 Generalized anxiety disorder: Secondary | ICD-10-CM

## 2019-09-12 NOTE — Progress Notes (Signed)
[facility] Counselor/Therapist Progress Note  Patient ID: Sherry Middleton, MRN: [id number],    Date: 09/12/2019  Time Spent: 50 minutes start time 11:08 AM end time 11:58 AM Virtual Visit via Telephone Note Connected with patient by a video enabled telemedicine/telehealth application or telephone, with their informed consent, and verified patient privacy and that I am speaking with the correct person using two identifiers. I discussed the limitations, risks, security and privacy concerns of performing psychotherapy and management service by telephone and the availability of in person appointments. I also discussed with the patient that there may be a patient responsible charge related to this service. The patient expressed understanding and agreed to proceed. I discussed the treatment planning with the patient. The patient was provided an opportunity to ask questions and all were answered. The patient agreed with the plan and demonstrated an understanding of the instructions. The patient was advised to call  our office if  symptoms worsen or feel they are in a crisis state and need immediate contact.   Therapist Location: office Patient Location: home   Treatment Type: Individual Therapy  Reported Symptoms: obsessive thinking, IBS flare, anxiety, crying spells, overwhelmed  Mental Status Exam:  Appearance:   NA     Behavior:  Sharing  Motor:  na  Speech/Language:   Normal Rate  Affect:  NA  Mood:  anxious  Thought process:  normal  Thought content:    Obsessions  Sensory/Perceptual disturbances:    WNL  Orientation:  oriented to person, place, time/date and situation  Attention:  Good  Concentration:  Good  Memory:  WNL  Fund of knowledge:   Good  Insight:    Good  Judgment:   Good  Impulse Control:  Good   Risk Assessment: Danger to Self:  No Self-injurious Behavior: No Danger to Others: No Duty to Warn:no Physical Aggression / Violence:No  Access to Firearms a  concern: No  Gang Involvement:No   Subjective: Met with patient through [employer].  She shared that her IBS flare was real bad but her panic is better overall.   She has had issues with her Obsessive thinking but she was able to accomplish some things and that has been good.  She is trying to figure out how to manage all that she is feeling.  Patient shared she is overwhelmed by everything.  She shared that she felt she did have a breakthrough and she realized it is okay to not please everyone.  Patient shared she is struggling with lots of unknowns in her life and the world.  Discussed how the IBS is telling her that she has to figure out how to deal with things she can't do anything about.  The importance of keeping her perspective and staying grounded rather than letting the negative overcome her was addressed with patient.  Reminded her she has to write out the positive things and focus on those things rather than the negative.  Discussed how thoughts lead to feelings to behaviors and we have to start with our thoughts.  She admitted she had focused greatly on the news and realized it was emotionally and mentally exhausting her.  She recognized she has to only watch minimal news and work on finding positive things to read and focus on instead.  Patient was also encouraged to continue focusing on things that she can control fix and change and to practice her coping skills to release her emotions in an appropriate manner.  Patient did  say that her family seems to be getting healthy and that was a positive thing.  She was reminded that thinking about those things could be more helpful than anything else.  Patient was also encouraged to consider coming once a months due to finances.  Patient agreed to work on things discussed in session.  Interventions: Cognitive Behavioral Therapy and Solution-Oriented/Positive Psychology  Diagnosis:   ICD-10-CM   1. Generalized anxiety disorder  F41.1     Plan:  Patient is to utilize CBT and coping skills to decrease anxiety symptoms.  Patient is to start writing out the positive things and remind her self that things can work out okay even when it does not look like it in the moment.  Patient is to decrease her time on new use in social media and increase trying to read positive stories are positive things. Long-term goal: Resolve the core conflict that is the source of the anxiety Short-term goal: Identify the major life conflicts from past and present the form the basis for present anxiety Understanding of beliefs and messages that produce the worry and anxiety   [name], LCMHC                   

## 2019-09-26 ENCOUNTER — Ambulatory Visit (INDEPENDENT_AMBULATORY_CARE_PROVIDER_SITE_OTHER): Payer: Self-pay | Admitting: Psychiatry

## 2019-09-26 DIAGNOSIS — F411 Generalized anxiety disorder: Secondary | ICD-10-CM

## 2019-09-26 NOTE — Progress Notes (Signed)
[facility] Counselor/Therapist Progress Note  Patient ID: Sherry Middleton, MRN: [id number],    Date: 09/26/2019  Time Spent:  37 Minutes start time 9:04 AM end time 9:54 AM Virtual Visit via Telephone Note Connected with patient by a video enabled telemedicine/telehealth application, with their informed consent, and verified patient privacy and that I am speaking with the correct person using two identifiers. I discussed the limitations, risks, security and privacy concerns of performing psychotherapy and management service by telephone and the availability of in person appointments. I also discussed with the patient that there may be a patient responsible charge related to this service. The patient expressed understanding and agreed to proceed. I discussed the treatment planning with the patient. The patient was provided an opportunity to ask questions and all were answered. The patient agreed with the plan and demonstrated an understanding of the instructions. The patient was advised to call  our office if  symptoms worsen or feel they are in a crisis state and need immediate contact.   Therapist Location: office Patient Location: home    Treatment Type: Individual Therapy  Reported Symptoms: anxiety, panic, crying spells, IBS flare, anger, frustration, depression, crying spells, flashbacks, isolation, obsessive thinking  Mental Status Exam:  Appearance:   Casual     Behavior:  Appropriate  Motor:  Normal  Speech/Language:   Normal Rate  Affect:  Appropriate  Mood:  anxious  Thought process:  circumstantial  Thought content:    WNL  Sensory/Perceptual disturbances:    Headache   Orientation:  oriented to person, place, time/date and situation  Attention:  Good  Concentration:  Fair  Memory:  Immediate;   [place] of knowledge:   Good  Insight:    Good  Judgment:   Good  Impulse Control:  Good   Risk Assessment: Danger to Self:  No Self-injurious Behavior:  No Danger to Others: No Duty to Warn:no Physical Aggression / Violence:No  Access to Firearms a concern: No  Gang Involvement:No   Subjective: Met with patient via virtual session through [employer]. She shared that she is still unable to get to the  Office due to her IBS flare.  Patient explained that she is struggling due to being fired and Disability turning her down.  She explained she is not sure what she is going to do because she can't seem to stay out of the bathroom.  Patient reported she talked to her sister about their childhood.  It triggered that fact that she had to do things as a child she did not need to have to do, like  Singing in the bar at 6.  She shared she had to take care of her parents when they were intoxicated.  Did EMDR set on having to pick her mother up off of the bathroom floor in a bar at 7, Subs level was 8, negative cognition "I'm responsible", felt anxiety in her stomach.  Patient was able to reduce SUDS  Level to 2. Discussed the importance of her continuing to work on trying to take care of herself the best she can.  Patient was encouraged to continue using coping skills and to work on reminding herself that she is not responsible for everyone.  Patient was also encouraged to take things 1 small step at a time and to recognize even though she is having anxiety and depression is also having a lot of medical issues that have to be addressed.  Interventions: Solution-Oriented/Positive Psychology and Eye  Movement Desensitization and Reprocessing (EMDR)  Diagnosis:   ICD-10-CM   1. Generalized anxiety disorder  F41.1     Plan: Patient is to utilize CBT and coping skills to help decrease anxiety symptoms.  Patient is to work on her self talk reminding herself that she is not responsible for everything and she has to take things 1 step at a time. Long-term goal: Resolve the core conflict that is the source of anxiety Short-term goal: Identify the major life complex from the  past and present that form the basis for present anxiety Increase understanding of beliefs and messages that produce the worry and anxiety  [name], [facility]

## 2019-09-29 ENCOUNTER — Encounter: Payer: Self-pay | Admitting: Adult Health

## 2019-09-29 ENCOUNTER — Ambulatory Visit (INDEPENDENT_AMBULATORY_CARE_PROVIDER_SITE_OTHER): Payer: Self-pay | Admitting: Adult Health

## 2019-09-29 DIAGNOSIS — F410 Panic disorder [episodic paroxysmal anxiety] without agoraphobia: Secondary | ICD-10-CM

## 2019-09-29 DIAGNOSIS — F429 Obsessive-compulsive disorder, unspecified: Secondary | ICD-10-CM

## 2019-09-29 DIAGNOSIS — F331 Major depressive disorder, recurrent, moderate: Secondary | ICD-10-CM

## 2019-09-29 DIAGNOSIS — F411 Generalized anxiety disorder: Secondary | ICD-10-CM

## 2019-09-29 DIAGNOSIS — F909 Attention-deficit hyperactivity disorder, unspecified type: Secondary | ICD-10-CM

## 2019-09-29 DIAGNOSIS — G4700 Insomnia, unspecified: Secondary | ICD-10-CM

## 2019-09-29 MED ORDER — ALPRAZOLAM 1 MG PO TABS: ORAL_TABLET | ORAL | 2 refills | Status: DC

## 2019-09-29 NOTE — Progress Notes (Signed)
Sherry Middleton [id number] 1966/08/07 54 y.o.  Virtual Visit via Telephone Note  I connected with pt on 09/29/19 at  2:20 PM EST by telephone and verified that I am speaking with the correct person using two identifiers.   I discussed the limitations, risks, security and privacy concerns of performing an evaluation and management service by telephone and the availability of in person appointments. I also discussed with the patient that there may be a patient responsible charge related to this service. The patient expressed understanding and agreed to proceed.   I discussed the assessment and treatment plan with the patient. The patient was provided an opportunity to ask questions and all were answered. The patient agreed with the plan and demonstrated an understanding of the instructions.   The patient was advised to call back or seek an in-person evaluation if the symptoms worsen or if the condition fails to improve as anticipated.  I provided 30 minutes of non-face-to-face time during this encounter.  The patient was located at home.  The provider was located at [place].   Aloha Gell, NP   Subjective:   Patient ID:  Sherry Middleton is a 54 y.o. (DOB [birth year]) female.  Chief Complaint: No chief complaint on file.   HPI Emaya Preston presents for follow-up of depression, anxiety, panic attacks, OCD, ADHD, and insomnia.   Describes mood today as "about the same". Tearful at times. Mood symptoms - reports depression, anxiety, and irritability. Stating "over the past 2 weeks, I've just wanted to withdrawal". Not wanting to engage with anyone. Stating "I don't want to be bothered". Has had a few panic attacks since last visit. Taking a lot of "deep breaths". Had a "huge" breakdown recently in PCP's office. Has been denied STD and terminated from her job. She has not gotten any compensation since being out of work. Sons have been helping she and husband out.  Still having issues with IBS - "going to the bathroom a lot". Does not feel like she is capable of returning to work. Has considered seeking unemployment. Having to push herself to get things done. Able to do "little things". Sees therapist weekly. Varying interest and motivation. Taking medications as prescribed. Energy levels low. Active, does not have a regular exercise routine. Enjoys some usual interests and activities. Married. Lives with husband. Spending time with family - 2 sons and their family (phone and video). Mostly staying home.  Appetite adequate. Weight gain. Sleeps well most nights. Averages 8 to 10 hours.  Focus and concentration difficulties. Completing tasks. Managing some aspects of household - doing minimal house cleaning. Out of work currently - terminated. Denies SI or HI. Denies AH or VH.   Review of Systems:  Review of Systems  Musculoskeletal: Negative for gait problem.  Neurological: Negative for tremors.  Psychiatric/Behavioral:       Please refer to HPI    Medications: I have reviewed the patient's current medications.  Current Outpatient Medications  Medication Sig Dispense Refill  . ALPRAZolam (XANAX) 1 MG tablet Take 1 tablet three times daily prn anxiety 90 tablet 2  . colestipol (COLESTID) 1 g tablet Take 1 tablet (1 g total) by mouth 2 (two) times daily. 60 tablet 1  . DUEXIS 800-26.6 MG TABS Take 1 tablet by mouth every 8 (eight) hours as needed (Pain).     [name] escitalopram (LEXAPRO) 20 MG tablet Take one tablet daily. 30 tablet 5  . furosemide (LASIX) 20 MG tablet Take 20 mg by mouth daily.     [name]  lisinopril (ZESTRIL) 20 MG tablet Take 20 mg by mouth daily.     [name] lisinopril-hydrochlorothiazide (PRINZIDE,ZESTORETIC) 20-12.5 MG per tablet Take 1 tablet by mouth daily.     . metoCLOPramide (REGLAN) 10 MG tablet Take 1 tablet by mouth 30 min before drinking prep. 2 tablet 0  . omeprazole (PRILOSEC) 40 MG capsule Take 1 capsule (40 mg total) by mouth 2  (two) times daily. 90 capsule 3  . ondansetron (ZOFRAN ODT) 4 MG disintegrating tablet Take 1 tablet (4 mg total) by mouth every 6 (six) hours as needed for nausea or vomiting. 90 tablet 1  . ondansetron (ZOFRAN) 4 MG tablet Take 4 mg by mouth as needed (nausea).     . pantoprazole (PROTONIX) 40 MG tablet Take 1 tablet (40 mg total) by mouth 2 (two) times daily. 60 tablet 3  . polyethylene glycol powder (GLYCOLAX/MIRALAX) 17 GM/SCOOP powder Take 0.5 Containers by mouth daily as needed for mild constipation or moderate constipation. As needed    . Probiotic Product (ALIGN) 4 MG CAPS Take 4 mg by mouth daily.    . promethazine (PHENERGAN) 25 MG tablet Take 25 mg by mouth every 6 (six) hours as needed for nausea or vomiting.    . Scar GEL Apply 1 application topically daily as needed (Scar). As needed    . spironolactone (ALDACTONE) 25 MG tablet Take 25 mg by mouth daily.     Manus Gunning BOWEL PREP KIT 17.5-3.13-1.6 GM/177ML SOLN Take 1 kit by mouth as directed. For colonoscopy prep 354 mL 0  . Vitamin D, Ergocalciferol, (DRISDOL) 1.25 MG (50000 UT) CAPS capsule Take 50,000 Units by mouth 2 (two) times a week.     . Wheat Dextrin (BENEFIBER DRINK MIX PO) Take 1 Package by mouth daily as needed (constipation).     No current facility-administered medications for this visit.    Medication Side Effects: None  Allergies:  Allergies  Allergen Reactions  . Levaquin [Levofloxacin In D5w] Shortness Of Breath    avalox and cipro   . Other Other (See Comments)    Steroids Hyper sensitive/palpitations  . Asa [Aspirin] Other (See Comments)    Ulcers  . Ibuprofen Other (See Comments)    Ulcers  . Sulfonamide Derivatives Other (See Comments)    Finger nails turn blue    Past Medical History:  Diagnosis Date  . Anal fissure   . Anemia   . Anxiety   . Depression   . GERD (gastroesophageal reflux disease)   . History of weight change   . Hypertension   . Hypertension   . Palpitations   . Peptic  ulcer disease   . Scar   . Swelling     Family History  Problem Relation Age of Onset  . Alcoholism Father   . Esophageal cancer Father   . Heart disease Mother   . Kidney disease Mother   . Breast cancer Sister        1/2 sister  . Diabetes Paternal Grandmother   . Heart disease Paternal Grandmother   . Diabetes Paternal Grandfather   . Crohn's disease Cousin   . Crohn's disease Niece   . Colon cancer Neg Hx   . Pancreatic cancer Neg Hx   . Stomach cancer Neg Hx     Social History   Socioeconomic History  . Marital status: Married    Spouse name: Not on file  . Number of children: Not on file  . Years of education: Not on file  .  Highest education level: Not on file  Occupational History  . Not on file  Tobacco Use  . Smoking status: Never Smoker  . Smokeless tobacco: Never Used  Substance and Sexual Activity  . Alcohol use: No  . Drug use: No  . Sexual activity: Not on file  Other Topics Concern  . Not on file  Social History Narrative  . Not on file   Social Determinants of Health   Financial Resource Strain:   . Difficulty of Paying Living Expenses: Not on file  Food Insecurity:   . Worried About Charity fundraiser in the Last Year: Not on file  . Ran Out of Food in the Last Year: Not on file  Transportation Needs:   . Lack of Transportation (Medical): Not on file  . Lack of Transportation (Non-Medical): Not on file  Physical Activity:   . Days of Exercise per Week: Not on file  . Minutes of Exercise per Session: Not on file  Stress:   . Feeling of Stress : Not on file  Social Connections:   . Frequency of Communication with Friends and Family: Not on file  . Frequency of Social Gatherings with Friends and Family: Not on file  . Attends Religious Services: Not on file  . Active Member of Clubs or Organizations: Not on file  . Attends Archivist Meetings: Not on file  . Marital Status: Not on file  Intimate Partner Violence:   . Fear  of Current or Ex-Partner: Not on file  . Emotionally Abused: Not on file  . Physically Abused: Not on file  . Sexually Abused: Not on file    Past Medical History, Surgical history, Social history, and Family history were reviewed and updated as appropriate.   Please see review of systems for further details on the patient's review from today.   Objective:   Physical Exam:  There were no vitals taken for this visit.  Physical Exam Neurological:     Mental Status: She is alert and oriented to person, place, and time.     Cranial Nerves: No dysarthria.  Psychiatric:        Attention and Perception: Attention and perception normal.        Mood and Affect: Mood is anxious and depressed.        Speech: Speech normal.        Behavior: Behavior is cooperative.        Thought Content: Thought content normal. Thought content is not paranoid or delusional. Thought content does not include homicidal or suicidal ideation. Thought content does not include homicidal or suicidal plan.        Cognition and Memory: Cognition and memory normal.        Judgment: Judgment normal.     Comments: Insight intact     Lab Review:     Component Value Date/Time   NA 140 10/14/2007 2047   K 4.0 10/14/2007 2047   CL 104 10/14/2007 2047   CO2 25 10/14/2007 2047   GLUCOSE 96 10/14/2007 2047   BUN 9 10/14/2007 2047   CREATININE 0.68 10/14/2007 2047   CALCIUM 8.8 10/14/2007 2047       Component Value Date/Time   WBC 7.3 10/14/2007 2047   RBC 3.98 10/14/2007 2047   HGB 8.3 (L) 10/14/2007 2047   HCT 30.3 (L) 10/14/2007 2047   PLT 380 10/14/2007 2047   MCV 76.1 (L) 10/14/2007 2047   MCHC 27.4 (L) 10/14/2007 2047  RDW 21.9 (H) 10/14/2007 2047   LYMPHSABS 2.0 10/25/2006 1625   MONOABS 0.5 10/25/2006 1625   EOSABS 0.1 10/25/2006 1625   BASOSABS 0.0 10/25/2006 1625    No results found for: POCLITH, LITHIUM   No results found for: PHENYTOIN, PHENOBARB, VALPROATE, CBMZ   .res Assessment: Plan:     Plan: 1. Continue Lexapro 58m daily as tolerated.  2. Continue Xanax 186mTID  Continue therapy with HoLina SayreAgree to continue disability at this time - unable to work.   Discussed potential benefits, risk, and side effects of benzodiazepines to include potential risk of tolerance and dependence, as well as possible drowsiness.  Advised patient not to drive if experiencing drowsiness and to take lowest possible effective dose to minimize risk of dependence and tolerance.   Diagnoses and all orders for this visit:  Generalized anxiety disorder -     Discontinue: ALPRAZolam (XANAX) 1 MG tablet; Take 1 tablet three times daily prn anxiety -     ALPRAZolam (XANAX) 1 MG tablet; Take 1 tablet three times daily prn anxiety  Major depressive disorder, recurrent episode, moderate (HCC)  Obsessive-compulsive disorder, unspecified type  Panic attacks -     Discontinue: ALPRAZolam (XANAX) 1 MG tablet; Take 1 tablet three times daily prn anxiety -     ALPRAZolam (XANAX) 1 MG tablet; Take 1 tablet three times daily prn anxiety  Attention deficit hyperactivity disorder (ADHD), unspecified ADHD type  Insomnia, unspecified type    Please see After Visit Summary for patient specific instructions.  Future Appointments  Date Time Provider [place]2/11/2019  2:10 PM [place], StCarlota RaspberryMD [facility]    No orders of the defined types were placed in this encounter.     -------------------------------

## 2019-10-08 ENCOUNTER — Encounter: Payer: Self-pay | Admitting: Gastroenterology

## 2019-10-08 ENCOUNTER — Ambulatory Visit (INDEPENDENT_AMBULATORY_CARE_PROVIDER_SITE_OTHER): Payer: Self-pay | Admitting: Gastroenterology

## 2019-10-08 ENCOUNTER — Other Ambulatory Visit: Payer: Self-pay

## 2019-10-08 VITALS — BP 134/70 | HR 68 | Temp 96.5°F | Ht 65.5 in | Wt 365.0 lb

## 2019-10-08 DIAGNOSIS — R110 Nausea: Secondary | ICD-10-CM

## 2019-10-08 DIAGNOSIS — K580 Irritable bowel syndrome with diarrhea: Secondary | ICD-10-CM

## 2019-10-08 DIAGNOSIS — Z9049 Acquired absence of other specified parts of digestive tract: Secondary | ICD-10-CM

## 2019-10-08 MED ORDER — DICYCLOMINE HCL 10 MG PO CAPS: 10.0000 mg | ORAL_CAPSULE | Freq: Three times a day (TID) | ORAL | 1 refills | Status: DC | PRN

## 2019-10-08 MED ORDER — ONDANSETRON 4 MG PO TBDP: 4.0000 mg | ORAL_TABLET | Freq: Four times a day (QID) | ORAL | 3 refills | Status: AC | PRN

## 2019-10-08 MED ORDER — COLESTIPOL HCL 1 G PO TABS: ORAL_TABLET | ORAL | 3 refills | Status: DC

## 2019-10-08 MED ORDER — ONDANSETRON 4 MG PO TBDP: 4.0000 mg | ORAL_TABLET | Freq: Four times a day (QID) | ORAL | 3 refills | Status: DC | PRN

## 2019-10-08 NOTE — Progress Notes (Signed)
Patient wanted prescriptions sent to the [facility] in [place] instead of [place].  Scripts sent to [facility] and called [facility] to cancel Zofran 4mg  ODT, Bentyl 10mg  and Colestid 1 gram which had already been sent.

## 2019-10-08 NOTE — Patient Instructions (Addendum)
If you are age 55 or older, your body mass index should be between 23-30. Your Body mass index is 59.82 kg/m. If this is out of the aforementioned range listed, please consider follow up with your Primary Care Provider.  If you are age 5 or younger, your body mass index should be between 19-25. Your Body mass index is 59.82 kg/m. If this is out of the aformentioned range listed, please consider follow up with your Primary Care Provider.   We have sent the following medications to your pharmacy for you to pick up at your convenience: Zofran 4mg  ODT: take every 6 to 8 hours as needed Bentyl 10mg : Take 1 to 2 tablets every 8 hours as needed Colestid 1 g: Take once to twice a day as needed  We are giving you a Low FOD-Map diet to review and follow.   We will request your labs from Orlinda Blalock' office.    Thank you for entrusting me with your care and for choosing [facility], Dr. Pimaco Two [place]ellar

## 2019-10-08 NOTE — Progress Notes (Signed)
HPI :  54 year old female here for a follow-up visit for bowel changes.  She was seen previously in the office by Ellouise Newer in September 2020.  At that time she complained of bowel problems and epigastric pain.  She was referred for an EGD and colonoscopy which were performed on June 10, 2019.  Findings as outlined below.  She did not have any significant abnormalities noted, biopsies of the stomach, small bowel, esophagus, colon were all normal.  She had no adenomatous or precancerous polyps.  Biopsies negative for microscopic colitis.  She states since she is last been seen her stools are not as loose as they were however she continues to have increased frequency and urgency at times.  On average she has anywhere from 4-5 bowel movements a day, often with some urgency after she eats something.  Eating fast food or greasy meal can give her more frequent urgency.  Max frequency can be up to 8 times per day.  No blood in her stools.  We had recommended a trial of Colestid for her diarrhea in light of her postcholecystectomy state, she had not yet tried that.  She has been under good deal of stress and having anxiety in general, she is now out of work which has contributed to this.  She has periodic cramps at times and can take Bentyl which she thinks helps a little bit.  She otherwise has nausea that bothers her periodically.  This occurs sometimes with eating, often without eating and just sporadically.  She does not have any vomiting at all.  She has been taking omeprazole 40 mg a day, seems to control her reflux symptoms fairly well.  No dysphagia right now.  She takes Zofran and this generally works quite well for her nausea.  She denies any correlation with any medication she has been taking in relation to her nausea.  She denies early satiety and otherwise has been eating well.  EGD 06/10/19  -  There was a benign small gastric inlet patch in the proximal esophagus. The exam of the  esophagus was otherwise normal. No stricture / stenosis, no Barrett's esophagus or esophagitis. Biopsies were taken with a cold forceps in the upper third of the esophagus, in the middle third of the esophagus and in the lower third of the esophagus for histology. The entire examined stomach was normal. Biopsies were taken with a cold forceps for Helicobacter pylori testing. The duodenal bulb and second portion of the duodenum were normal. Biopsies for histology were taken with a cold forceps for evaluation of celiac disease.  Colonoscopy 06/10/19 -  The terminal ileum appeared normal. Multiple small-mouthed diverticula were found in the sigmoid colon and cecum (one). A 3 mm polyp was found in the sigmoid colon. The polyp was sessile. The polyp was removed with a cold biopsy forceps. Resection and retrieval were complete. Internal hemorrhoids were found during retroflexion. Biopsies taken to rule out MC  Biopsies of small bowel, stomach, esophagus, colon normal Hyperplastic polyp   Past Medical History:  Diagnosis Date  . Anal fissure   . Anemia   . Anxiety   . Depression   . GERD (gastroesophageal reflux disease)   . History of weight change   . Hypertension   . Hypertension   . Palpitations   . Peptic ulcer disease   . Scar   . Swelling      Past Surgical History:  Procedure Laterality Date  . BIOPSY  06/10/2019   Procedure:  BIOPSY;  Surgeon: Yetta Flock, MD;  Location: [facility];  Service: Gastroenterology;;  . CHOLECYSTECTOMY    . club feet surgery     as a child  . COLONOSCOPY WITH PROPOFOL N/A 06/10/2019   Procedure: COLONOSCOPY WITH PROPOFOL;  Surgeon: Yetta Flock, MD;  Location: [facility];  Service: Gastroenterology;  Laterality: N/A;  . DILATION AND CURETTAGE OF UTERUS    . DILATION AND CURETTAGE OF UTERUS    . DILATION AND CURETTAGE OF UTERUS     x 2  . ESOPHAGOGASTRODUODENOSCOPY (EGD) WITH PROPOFOL N/A 06/10/2019   Procedure:  ESOPHAGOGASTRODUODENOSCOPY (EGD) WITH PROPOFOL;  Surgeon: Yetta Flock, MD;  Location: [facility];  Service: Gastroenterology;  Laterality: N/A;  . HERNIA REPAIR    . KNEE ARTHROSCOPY Left   . PARTIAL HYSTERECTOMY     ovaries still intact  . POLYPECTOMY  06/10/2019   Procedure: POLYPECTOMY;  Surgeon: Yetta Flock, MD;  Location: [facility];  Service: Gastroenterology;;  . TUBAL LIGATION     Family History  Problem Relation Age of Onset  . Alcoholism Father   . Esophageal cancer Father   . Heart disease Mother   . Kidney disease Mother   . Breast cancer Sister        1/2 sister  . Diabetes Paternal Grandmother   . Heart disease Paternal Grandmother   . Diabetes Paternal Grandfather   . Crohn's disease Cousin   . Crohn's disease Niece   . Colon cancer Neg Hx   . Pancreatic cancer Neg Hx   . Stomach cancer Neg Hx    Social History   Tobacco Use  . Smoking status: Never Smoker  . Smokeless tobacco: Never Used  Substance Use Topics  . Alcohol use: No  . Drug use: No   Current Outpatient Medications  Medication Sig Dispense Refill  . ALPRAZolam (XANAX) 1 MG tablet Take 1 tablet three times daily prn anxiety 90 tablet 2  . DUEXIS 800-26.6 MG TABS Take 1 tablet by mouth every 8 (eight) hours as needed (Pain).     [name] escitalopram (LEXAPRO) 20 MG tablet Take one tablet daily. 30 tablet 5  . furosemide (LASIX) 20 MG tablet Take 20 mg by mouth daily.     [name] lisinopril (ZESTRIL) 20 MG tablet Take 20 mg by mouth daily.     [name] lisinopril-hydrochlorothiazide (PRINZIDE,ZESTORETIC) 20-12.5 MG per tablet Take 1 tablet by mouth daily.     [name] omeprazole (PRILOSEC) 40 MG capsule Take 1 capsule (40 mg total) by mouth 2 (two) times daily. 90 capsule 3  . polyethylene glycol powder (GLYCOLAX/MIRALAX) 17 GM/SCOOP powder Take 0.5 Containers by mouth daily as needed for mild constipation or moderate constipation. As needed    . Probiotic Product (ALIGN) 4 MG CAPS Take 4 mg by mouth  daily.    . Scar GEL Apply 1 application topically daily as needed (Scar). As needed    . spironolactone (ALDACTONE) 25 MG tablet Take 25 mg by mouth daily.     . Vitamin D, Ergocalciferol, (DRISDOL) 1.25 MG (50000 UT) CAPS capsule Take 50,000 Units by mouth 2 (two) times a week.     . Wheat Dextrin (BENEFIBER DRINK MIX PO) Take 1 Package by mouth daily as needed (constipation).    . colestipol (COLESTID) 1 g tablet Take 1 tablet (1 gram) by mouth once to twice a day as needed 60 tablet 3  . dicyclomine (BENTYL) 10 MG capsule Take 1-2 capsules (10-20 mg total) by mouth  every 8 (eight) hours as needed for spasms. 90 capsule 1  . ondansetron (ZOFRAN ODT) 4 MG disintegrating tablet Take 1 tablet (4 mg total) by mouth every 6 (six) hours as needed for nausea or vomiting. 90 tablet 3   No current facility-administered medications for this visit.   Allergies  Allergen Reactions  . Levaquin [Levofloxacin In D5w] Shortness Of Breath    avalox and cipro   . Other Other (See Comments)    Steroids Hyper sensitive/palpitations  . Asa [Aspirin] Other (See Comments)    Ulcers  . Ibuprofen Other (See Comments)    Ulcers  . Sulfonamide Derivatives Other (See Comments)    Finger nails turn blue     Review of Systems: All systems reviewed and negative except where noted in HPI.   No labs on file in our system. [name]  Physical Exam: BP 134/70   Pulse 68   Temp (!) 96.5 F (35.8 C)   Ht 5' 5.5" (1.664 m)   Wt (!) 365 lb (165.6 kg)   BMI 59.82 kg/m  Constitutional: Pleasant,well-developed, female in no acute distress. Neurological: Alert and oriented to person place and time. Skin: Skin is warm and dry. No rashes noted. Psychiatric: Normal mood and affect. Behavior is normal.   ASSESSMENT AND PLAN: 54 year old female here for reassessment of the following:  IBS-D / history of cholecystectomy - suspect the patient may have underlying IBS-D driving her symptoms.  Her recent endoscopic  evaluation ruled out celiac disease and microscopic colitis.  She is status post cholecystectomy, suspect she could have some bile salt diarrhea as well, in light of her urgency, discussed again trial of Colestid with her, I think this will decrease her frequency and urgency after she eats.  She was agreeable to try Colestid 1 g twice a day as needed.  I also counseled her on a low FODMAP diet to see if there is anything she is eating which could be contributing to symptoms.  I recommend she continue the Bentyl as needed as it appears to help.  She can touch base with me in a couple weeks and let me know how she is doing.  If no improvement we can consider other options.  She agreed  Chronic nausea - EGD negative, I will request basic labs from her primary care to review what she has had done in the past to make sure okay.  Zofran works quite well for her and will refill it for to use as needed.  This actually may help underlying IBS as well.  Gastroparesis seems unlikely to me as she is otherwise eating pretty well.  We will await her labs and see how she does with Zofran.  I reviewed her medications and time course of any of these does not really fit with association of her nausea.  She is happy to take Zofran for now for this, she can follow-up as needed if symptoms persist or if they change.  I spent 35 minutes of time, including in depth chart review, independent review of results as outlined above, communicating results with the patient directly, face-to-face time with the patient, coordinating care, and ordering studies and medications as appropriate, and documenting this encounter.   Carrollton Cellar, MD [facility]

## 2019-10-22 ENCOUNTER — Ambulatory Visit (INDEPENDENT_AMBULATORY_CARE_PROVIDER_SITE_OTHER): Payer: Self-pay | Admitting: Psychiatry

## 2019-10-22 DIAGNOSIS — F411 Generalized anxiety disorder: Secondary | ICD-10-CM

## 2019-10-22 NOTE — Progress Notes (Signed)
[facility] Counselor/Therapist Progress Note  Patient ID: Sherry Middleton, MRN: [id number],    Date: 10/23/2019  Time Spent: 50 minutes start time 12:04 PM end time 12:54 PM Virtual Visit via Telephone Note Connected with patient by a video enabled telemedicine/telehealth application, with their informed consent, and verified patient privacy and that I am speaking with the correct person using two identifiers. I discussed the limitations, risks, security and privacy concerns of performing psychotherapy and management service by telephone and the availability of in person appointments. I also discussed with the patient that there may be a patient responsible charge related to this service. The patient expressed understanding and agreed to proceed. I discussed the treatment planning with the patient. The patient was provided an opportunity to ask questions and all were answered. The patient agreed with the plan and demonstrated an understanding of the instructions. The patient was advised to call  our office if  symptoms worsen or feel they are in a crisis state and need immediate contact.   Therapist Location: office Patient Location: home   Treatment Type: Individual Therapy  Reported Symptoms: anxiety, sadness, sleep issues, fatigue, insomnia, nightmare  Mental Status Exam:  Appearance:   Casual     Behavior:  Appropriate  Motor:  Normal  Speech/Language:   Normal Rate  Affect:  Appropriate  Mood:  anxious and sad  Thought process:  normal  Thought content:    WNL  Sensory/Perceptual disturbances:    WNL  Orientation:  oriented to person, place, time/date and situation  Attention:  Good  Concentration:  Good  Memory:  Sanderson of knowledge:   Good  Insight:    Good  Judgment:   Good  Impulse Control:  Good   Risk Assessment: Danger to Self:  No Self-injurious Behavior: No Danger to Others: No Duty to Warn:no Physical Aggression / Violence:No  Access to  Firearms a concern: No  Gang Involvement:No   Subjective: Met with patient via virtual session through [employer].  She shared that she lost her disability and has applied for unemployment.  Patient reported that she missed a court date and had to go to the court house to deal with it and that was hard.  She shared she did get through it okay.  She went on to share that they got a puppy and that has been therapeutic for her and she is hopeful that she will train it to be a therapy dog. She shared that she has been some better overall, but when she gets overwhelmed she has a panic attack and has to take her meds and go to bed.  Patient stated she is realizing that sensory over load is difficult for her.Encouraged patient to see if she can sign up for health care on the market.She also shared that her IBS has been better since she got her puppy.  Patient also discussed the fact that things seem to be going somewhat better within her family system.  She shared that everybody seems to be safe at this time and the people that were struggling with COVID are in rehab and hopefully working towards a positive end.  She did share that her son is having issues with anxiety and she is concerned about him but she is trying to keep perspective and remember he has to figure things out on his own and he will.  She was encouraged to remember her coping skills and to practice them on a regular basis.  Patient  stated at this time she feels she has a better perspective on her early childhood trauma and she is not getting triggered like she was.  She reported that she feels EMDR from other sessions has been very helpful and that some of those issues are resolved and she is seeing things very differently.  Patient was encouraged to continue using her self talk to try and change her perspective and feel different about her situation.  Will call office when she needs to get back in for another appointment.  Interventions:  Solution-Oriented/Positive Psychology  Diagnosis:   ICD-10-CM   1. Generalized anxiety disorder  F41.1     Plan: Patient is to utilize CBT and coping skills to continue to decrease anxiety symptoms.  Patient is to research how to get her dog certified to become a therapy dog.  Patient is to look into having insurance on the market place.  Patient is to continue pursuing getting her an appointment benefit and trying to figure out options for income Long-term goal: Resolve the core conflict that is the source of anxiety Short-term goal: Identify the major life complex in the past and present that form the basis for present anxiety Increase understanding of beliefs and messages that produce the worry and anxiety  [name], [facility]

## 2019-10-30 ENCOUNTER — Ambulatory Visit: Payer: Self-pay | Admitting: Adult Health

## 2019-11-03 ENCOUNTER — Ambulatory Visit: Payer: Self-pay | Admitting: Adult Health

## 2019-11-04 ENCOUNTER — Ambulatory Visit: Payer: Self-pay | Admitting: Adult Health

## 2019-11-06 ENCOUNTER — Ambulatory Visit: Payer: Self-pay | Admitting: Adult Health

## 2019-11-10 ENCOUNTER — Ambulatory Visit: Payer: Self-pay | Admitting: Adult Health

## 2019-11-12 ENCOUNTER — Ambulatory Visit: Payer: Self-pay | Admitting: Adult Health

## 2019-11-12 ENCOUNTER — Ambulatory Visit (INDEPENDENT_AMBULATORY_CARE_PROVIDER_SITE_OTHER): Payer: Self-pay | Admitting: Adult Health

## 2019-11-12 ENCOUNTER — Encounter: Payer: Self-pay | Admitting: Adult Health

## 2019-11-12 DIAGNOSIS — F411 Generalized anxiety disorder: Secondary | ICD-10-CM

## 2019-11-12 DIAGNOSIS — F410 Panic disorder [episodic paroxysmal anxiety] without agoraphobia: Secondary | ICD-10-CM

## 2019-11-12 DIAGNOSIS — F331 Major depressive disorder, recurrent, moderate: Secondary | ICD-10-CM

## 2019-11-12 MED ORDER — ESCITALOPRAM OXALATE 20 MG PO TABS: ORAL_TABLET | ORAL | 5 refills | Status: DC

## 2019-11-12 MED ORDER — ALPRAZOLAM 1 MG PO TABS: ORAL_TABLET | ORAL | 2 refills | Status: DC

## 2019-11-12 NOTE — Progress Notes (Signed)
Sherry Middleton [id number] [birth year] 54 y.o.  Virtual Visit via Telephone Note  I connected with pt on 11/12/19 at  2:40 PM EST by telephone and verified that I am speaking with the correct person using two identifiers.   I discussed the limitations, risks, security and privacy concerns of performing an evaluation and management service by telephone and the availability of in person appointments. I also discussed with the patient that there may be a patient responsible charge related to this service. The patient expressed understanding and agreed to proceed.   I discussed the assessment and treatment plan with the patient. The patient was provided an opportunity to ask questions and all were answered. The patient agreed with the plan and demonstrated an understanding of the instructions.   The patient was advised to call back or seek an in-person evaluation if the symptoms worsen or if the condition fails to improve as anticipated.  I provided 30 minutes of non-face-to-face time during this encounter.  The patient was located at home.  The provider was located at [place].   Aloha Gell, NP   Subjective:   Patient ID:  Sherry Middleton is a 54 y.o. (DOB [birth year]) female.  Chief Complaint: No chief complaint on file.   HPI Sherry Middleton presents for follow-up of depression, anxiety, panic attacks, OCD, ADHD, and insomnia.   Describes mood today as "a little better". Pleasant. Tearful at times. Mood symptoms - reports depression, anxiety, and irritability. Stating "I'm having good and bad days". Has gotten a new puppy. Stating "I think she is going to save me versus me saving her". Stating "she is keeping me occupied". Recent CT scan. Having better days. Some days "can't function". Continues to have issues with IBS. Reports 3 to 4 panic attacks. Trying to work through paperwork for unemployment and taxes - "did it on my own timeline". Forgetting words at  times. Financial stressors. Seeing therapist. Varying interest and motivation. Taking medications as prescribed. Energy levels low. Active, does not have a regular exercise routine with physical disabilities.  Enjoys some usual interests and activities. Married. Lives with husband of 26 years. Spending time with family - 2 sons and their family (phone and video). Mostly staying home.  Appetite adequate. Trying to eat "better". Weight stable.  Sleeps well most nights. Averages 7 hours. Up and down during the night with puppy.  Focus and concentration difficulties - "improving, but not where it needs to be.". Completing tasks. Managing some aspects of household - some cleaning. Out of work currently. Denies SI or HI. Denies AH or VH.  Review of Systems:  Review of Systems  Musculoskeletal: Negative for gait problem.  Neurological: Negative for tremors.  Psychiatric/Behavioral:       Please refer to HPI    Medications: I have reviewed the patient's current medications.  Current Outpatient Medications  Medication Sig Dispense Refill  . ALPRAZolam (XANAX) 1 MG tablet Take 1 tablet three times daily prn anxiety 90 tablet 2  . colestipol (COLESTID) 1 g tablet Take 1 tablet (1 gram) by mouth once to twice a day as needed 60 tablet 3  . dicyclomine (BENTYL) 10 MG capsule Take 1-2 capsules (10-20 mg total) by mouth every 8 (eight) hours as needed for spasms. 90 capsule 1  . DUEXIS 800-26.6 MG TABS Take 1 tablet by mouth every 8 (eight) hours as needed (Pain).     [name] escitalopram (LEXAPRO) 20 MG tablet Take one tablet daily. 30 tablet 5  . furosemide (  LASIX) 20 MG tablet Take 20 mg by mouth daily.     [name] lisinopril (ZESTRIL) 20 MG tablet Take 20 mg by mouth daily.     [name] lisinopril-hydrochlorothiazide (PRINZIDE,ZESTORETIC) 20-12.5 MG per tablet Take 1 tablet by mouth daily.     [name] omeprazole (PRILOSEC) 40 MG capsule Take 1 capsule (40 mg total) by mouth 2 (two) times daily. 90 capsule 3  . ondansetron  (ZOFRAN ODT) 4 MG disintegrating tablet Take 1 tablet (4 mg total) by mouth every 6 (six) hours as needed for nausea or vomiting. 90 tablet 3  . polyethylene glycol powder (GLYCOLAX/MIRALAX) 17 GM/SCOOP powder Take 0.5 Containers by mouth daily as needed for mild constipation or moderate constipation. As needed    . Probiotic Product (ALIGN) 4 MG CAPS Take 4 mg by mouth daily.    . Scar GEL Apply 1 application topically daily as needed (Scar). As needed    . spironolactone (ALDACTONE) 25 MG tablet Take 25 mg by mouth daily.     . Vitamin D, Ergocalciferol, (DRISDOL) 1.25 MG (50000 UT) CAPS capsule Take 50,000 Units by mouth 2 (two) times a week.     . Wheat Dextrin (BENEFIBER DRINK MIX PO) Take 1 Package by mouth daily as needed (constipation).     No current facility-administered medications for this visit.    Medication Side Effects: None  Allergies:  Allergies  Allergen Reactions  . Levaquin [Levofloxacin In D5w] Shortness Of Breath    avalox and cipro   . Other Other (See Comments)    Steroids Hyper sensitive/palpitations  . Asa [Aspirin] Other (See Comments)    Ulcers  . Ibuprofen Other (See Comments)    Ulcers  . Sulfonamide Derivatives Other (See Comments)    Finger nails turn blue    Past Medical History:  Diagnosis Date  . Anal fissure   . Anemia   . Anxiety   . Depression   . GERD (gastroesophageal reflux disease)   . History of weight change   . Hypertension   . Hypertension   . Palpitations   . Peptic ulcer disease   . Scar   . Swelling     Family History  Problem Relation Age of Onset  . Alcoholism Father   . Esophageal cancer Father   . Heart disease Mother   . Kidney disease Mother   . Breast cancer Sister        1/2 sister  . Diabetes Paternal Grandmother   . Heart disease Paternal Grandmother   . Diabetes Paternal Grandfather   . Crohn's disease Cousin   . Crohn's disease Niece   . Colon cancer Neg Hx   . Pancreatic cancer Neg Hx   .  Stomach cancer Neg Hx     Social History   Socioeconomic History  . Marital status: Married    Spouse name: Not on file  . Number of children: Not on file  . Years of education: Not on file  . Highest education level: Not on file  Occupational History  . Not on file  Tobacco Use  . Smoking status: Never Smoker  . Smokeless tobacco: Never Used  Substance and Sexual Activity  . Alcohol use: No  . Drug use: No  . Sexual activity: Not on file  Other Topics Concern  . Not on file  Social History Narrative  . Not on file   Social Determinants of Health   Financial Resource Strain:   . Difficulty of Paying Living Expenses: Not  on file  Food Insecurity:   . Worried About Charity fundraiser in the Last Year: Not on file  . Ran Out of Food in the Last Year: Not on file  Transportation Needs:   . Lack of Transportation (Medical): Not on file  . Lack of Transportation (Non-Medical): Not on file  Physical Activity:   . Days of Exercise per Week: Not on file  . Minutes of Exercise per Session: Not on file  Stress:   . Feeling of Stress : Not on file  Social Connections:   . Frequency of Communication with Friends and Family: Not on file  . Frequency of Social Gatherings with Friends and Family: Not on file  . Attends Religious Services: Not on file  . Active Member of Clubs or Organizations: Not on file  . Attends Archivist Meetings: Not on file  . Marital Status: Not on file  Intimate Partner Violence:   . Fear of Current or Ex-Partner: Not on file  . Emotionally Abused: Not on file  . Physically Abused: Not on file  . Sexually Abused: Not on file    Past Medical History, Surgical history, Social history, and Family history were reviewed and updated as appropriate.   Please see review of systems for further details on the patient's review from today.   Objective:   Physical Exam:  There were no vitals taken for this visit.  Physical Exam Constitutional:       General: She is not in acute distress. Musculoskeletal:        General: No deformity.  Neurological:     Mental Status: She is alert and oriented to person, place, and time.     Coordination: Coordination normal.  Psychiatric:        Attention and Perception: Attention and perception normal. She does not perceive auditory or visual hallucinations.        Mood and Affect: Mood is anxious and depressed. Affect is not labile, blunt, angry or inappropriate.        Speech: Speech normal.        Behavior: Behavior normal.        Thought Content: Thought content normal. Thought content is not paranoid or delusional. Thought content does not include homicidal or suicidal ideation. Thought content does not include homicidal or suicidal plan.        Cognition and Memory: Cognition and memory normal.        Judgment: Judgment normal.     Comments: Insight intact     Lab Review:     Component Value Date/Time   NA 140 10/14/2007 2047   K 4.0 10/14/2007 2047   CL 104 10/14/2007 2047   CO2 25 10/14/2007 2047   GLUCOSE 96 10/14/2007 2047   BUN 9 10/14/2007 2047   CREATININE 0.68 10/14/2007 2047   CALCIUM 8.8 10/14/2007 2047       Component Value Date/Time   WBC 7.3 10/14/2007 2047   RBC 3.98 10/14/2007 2047   HGB 8.3 (L) 10/14/2007 2047   HCT 30.3 (L) 10/14/2007 2047   PLT 380 10/14/2007 2047   MCV 76.1 (L) 10/14/2007 2047   MCHC 27.4 (L) 10/14/2007 2047   RDW 21.9 (H) 10/14/2007 2047   LYMPHSABS 2.0 10/25/2006 1625   MONOABS 0.5 10/25/2006 1625   EOSABS 0.1 10/25/2006 1625   BASOSABS 0.0 10/25/2006 1625    No results found for: POCLITH, LITHIUM   No results found for: PHENYTOIN, PHENOBARB, VALPROATE, CBMZ   .  res Assessment: Plan:    Plan: 1. Continue Lexapro 15mg  daily as tolerated - has reduced dose to 15mg  daily due to "lethargy". 2. Continue Xanax 1mg  TID  Note read and reviewed with patient for accuracy.   Continue therapy with [name]  Patient  disabled  Discussed potential benefits, risk, and side effects of benzodiazepines to include potential risk of tolerance and dependence, as well as possible drowsiness.  Advised patient not to drive if experiencing drowsiness and to take lowest possible effective dose to minimize risk of dependence and tolerance.  There are no diagnoses linked to this encounter.  Please see After Visit Summary for patient specific instructions.  No future appointments.  No orders of the defined types were placed in this encounter.     -------------------------------

## 2019-11-20 ENCOUNTER — Ambulatory Visit (INDEPENDENT_AMBULATORY_CARE_PROVIDER_SITE_OTHER): Payer: Self-pay | Admitting: Psychiatry

## 2019-11-20 DIAGNOSIS — F411 Generalized anxiety disorder: Secondary | ICD-10-CM

## 2019-11-20 NOTE — Progress Notes (Signed)
[facility] Counselor/Therapist Progress Note  Patient ID: Sherry Middleton, MRN: [id number],    Date: 11/22/2019  Time Spent: 51 minutes start time 2:04 PM end time 2:55 PM Virtual Visit via Telephone Note Connected with patient by a video enabled telemedicine/telehealth application, with their informed consent, and verified patient privacy and that I am speaking with the correct person using two identifiers. I discussed the limitations, risks, security and privacy concerns of performing psychotherapy and management service by telephone and the availability of in person appointments. I also discussed with the patient that there may be a patient responsible charge related to this service. The patient expressed understanding and agreed to proceed. I discussed the treatment planning with the patient. The patient was provided an opportunity to ask questions and all were answered. The patient agreed with the plan and demonstrated an understanding of the instructions. The patient was advised to call  our office if  symptoms worsen or feel they are in a crisis state and need immediate contact.   Therapist Location: office Patient Location: home    Treatment Type: Individual Therapy  Reported Symptoms: sadness, crying spells, anxiety, sleep issues,panic attacks  Mental Status Exam:  Appearance:   Casual     Behavior:  Appropriate  Motor:  Normal  Speech/Language:   Normal Rate  Affect:  Appropriate  Mood:  normal  Thought process:  normal  Thought content:    WNL  Sensory/Perceptual disturbances:    WNL  Orientation:  oriented to person, place, time/date and situation  Attention:  Good  Concentration:  Good  Memory:  WNL  Fund of knowledge:   Good  Insight:    Good  Judgment:   Good  Impulse Control:  Good   Risk Assessment: Danger to Self:  No Self-injurious Behavior: No Danger to Others: No Duty to Warn:no Physical Aggression / Violence:No  Access to Firearms a  concern: No  Gang Involvement:No   Subjective: Met with patient via webex.  She reported that today is a better day because she is canning.   She is enjoying her puppy and she has been so helpful with her mood. Patient explained she feels things have to be a certain way before she can do things.  I realized that can't plan things. She was able to pay some of her bills now that she is getting unemployment and that was huge.  She has had a few bad weeks because her husband has been in pain with his arthritis and she has felt she needed to fix it but things are better now. She is starting to realize she doesn't have to let people go off on her and she doesn't have to make everyone happy. Patient reported she has been happier recently than she has in a long time even though she is still not there. Patient explained she is trying to work on some issues with her husband. Encouraged patient to realizations are coming from all the processing she has done in treatment and she should feel positive about that. She shared she has had some panic attacks but she is using her coping skills and Xanax and that is helping.  The importance of her self-care was addressed with patient as well.  She did share she is getting get to go with one of her son's family to the beach for a week and feels that will be very positive for her.  Interventions: Solution-Oriented/Positive Psychology  Diagnosis:   ICD-10-CM   1. Generalized  anxiety disorder  F41.1     Plan: Patient is to use CBT and coping skills to manage emotions appropriately.  Patient is to work on her self talk and recognize all the progress she is made and the importance of her self-care. Long-term goal: Resolve the core conflict that is the source of anxiety Short-term goal: Identify the major life complex in the past and present that form the basis for present anxiety: Increase understanding of beliefs and messages that produce the worry and anxiety  [name],  [facility]

## 2019-11-21 ENCOUNTER — Ambulatory Visit: Payer: Self-pay | Admitting: Psychiatry

## 2020-01-19 ENCOUNTER — Telehealth: Payer: Self-pay | Admitting: Adult Health

## 2020-01-19 NOTE — Telephone Encounter (Signed)
Pt will need a refill on Xanax in a few weeks. Pt going out of town and wanted to call it in. Send to [facility] in [place].

## 2020-01-21 ENCOUNTER — Other Ambulatory Visit: Payer: Self-pay

## 2020-01-21 DIAGNOSIS — F411 Generalized anxiety disorder: Secondary | ICD-10-CM

## 2020-01-21 DIAGNOSIS — F410 Panic disorder [episodic paroxysmal anxiety] without agoraphobia: Secondary | ICD-10-CM

## 2020-01-21 MED ORDER — ALPRAZOLAM 1 MG PO TABS: ORAL_TABLET | ORAL | 2 refills | Status: DC

## 2020-01-21 NOTE — Telephone Encounter (Signed)
Last fill date 01/09/2020 pended with fill date of 02/06/2020

## 2020-02-09 ENCOUNTER — Encounter: Payer: Self-pay | Admitting: Adult Health

## 2020-02-09 ENCOUNTER — Ambulatory Visit (INDEPENDENT_AMBULATORY_CARE_PROVIDER_SITE_OTHER): Payer: Self-pay | Admitting: Adult Health

## 2020-02-09 ENCOUNTER — Other Ambulatory Visit: Payer: Self-pay

## 2020-02-09 DIAGNOSIS — F411 Generalized anxiety disorder: Secondary | ICD-10-CM

## 2020-02-09 DIAGNOSIS — F331 Major depressive disorder, recurrent, moderate: Secondary | ICD-10-CM

## 2020-02-09 DIAGNOSIS — F410 Panic disorder [episodic paroxysmal anxiety] without agoraphobia: Secondary | ICD-10-CM

## 2020-02-09 DIAGNOSIS — F429 Obsessive-compulsive disorder, unspecified: Secondary | ICD-10-CM

## 2020-02-09 MED ORDER — ESCITALOPRAM OXALATE 10 MG PO TABS: ORAL_TABLET | ORAL | 5 refills | Status: DC

## 2020-02-09 MED ORDER — ALPRAZOLAM 1 MG PO TABS: ORAL_TABLET | ORAL | 2 refills | Status: DC

## 2020-02-09 NOTE — Progress Notes (Signed)
Sherry Middleton [id number] 08-16-1966 54 y.o.  Subjective:   Patient ID:  Sherry Middleton is a 54 y.o. (DOB [birth year]) female.  Chief Complaint: No chief complaint on file.   HPI Sherry Middleton presents to the office today for follow-up of depression, anxiety, panic attacks, OCD, ADHD, and insomnia.   Describes mood today as "ok". Pleasant. Tearful at times. Mood symptoms - denies depression, anxiety, and irritability. Still having panic attacks "at times". For the most part has not been depressed. Has periods of tearfulness. Has tapered lexapro back to 10mg  daily. Stating "I didn't feel motivated to do anything". Felt like the decrease may be "helpful". Recently went to beach for a week with family and enjoyed it. Has arthritic feet and has gained weight. Trying to help husband keep things in order - husband is a Chiropractor". Feels more "stable" when things are where they are supposed to be. Continues to have issues with IBS. Getting out some days. Does not feel like she will be able to return to the work setting with IBS and panic attacks. Lost a brother in law recently. Enjoying her new dog. Wanting to get her house in order - "I think it will help me feel better". Chronic pain issues. Seeing therapist. Varying interest and motivation. Taking medications as prescribed. Energy levels low. Active, does not have a regular exercise routine with physical disabilities.  Enjoys some usual interests and activities. Married. Lives with husband of 26 years. Spending time with family - 2 sons and their family. Went out to eat with family recently.   Appetite adequate. Weight stable.  Sleeps well most nights. Averages 7 hours. Having "bad" dreams. Napping some days during the week.  Focus and concentration difficulties - "it has improved". Completing tasks. Managing some aspects of household - some cleaning.  Denies SI or HI. Denies AH or VH.   Review of Systems:  Review of Systems   Musculoskeletal: Negative for gait problem.  Neurological: Negative for tremors.  Psychiatric/Behavioral:       Please refer to HPI    Medications: I have reviewed the patient's current medications.  Current Outpatient Medications  Medication Sig Dispense Refill  . ALPRAZolam (XANAX) 1 MG tablet Take 1 tablet three times daily prn anxiety 90 tablet 2  . ascorbic acid (VITAMIN C) 500 MG tablet Take by mouth.    . colestipol (COLESTID) 1 g tablet Take 1 tablet (1 gram) by mouth once to twice a day as needed 60 tablet 3  . dicyclomine (BENTYL) 10 MG capsule Take 1-2 capsules (10-20 mg total) by mouth every 8 (eight) hours as needed for spasms. 90 capsule 1  . DUEXIS 800-26.6 MG TABS Take 1 tablet by mouth every 8 (eight) hours as needed (Pain).     [name] escitalopram (LEXAPRO) 10 MG tablet Take one tablet daily. 30 tablet 5  . furosemide (LASIX) 20 MG tablet Take 20 mg by mouth daily.     [name] lisinopril (ZESTRIL) 20 MG tablet Take 20 mg by mouth daily.     [name] lisinopril-hydrochlorothiazide (PRINZIDE,ZESTORETIC) 20-12.5 MG per tablet Take 1 tablet by mouth daily.     [name] omeprazole (PRILOSEC) 40 MG capsule Take 1 capsule (40 mg total) by mouth 2 (two) times daily. 90 capsule 3  . ondansetron (ZOFRAN ODT) 4 MG disintegrating tablet Take 1 tablet (4 mg total) by mouth every 6 (six) hours as needed for nausea or vomiting. 90 tablet 3  . polyethylene glycol powder (GLYCOLAX/MIRALAX) 17 GM/SCOOP powder Take 0.5  Containers by mouth daily as needed for mild constipation or moderate constipation. As needed    . Probiotic Product (ALIGN) 4 MG CAPS Take 4 mg by mouth daily.    . Scar GEL Apply 1 application topically daily as needed (Scar). As needed    . spironolactone (ALDACTONE) 25 MG tablet Take 25 mg by mouth daily.     . Vitamin D, Ergocalciferol, (DRISDOL) 1.25 MG (50000 UT) CAPS capsule Take 50,000 Units by mouth 2 (two) times a week.     . Wheat Dextrin (BENEFIBER DRINK MIX PO) Take 1 Package by mouth  daily as needed (constipation).     No current facility-administered medications for this visit.    Medication Side Effects: None  Allergies:  Allergies  Allergen Reactions  . Levaquin [Levofloxacin In D5w] Shortness Of Breath    avalox and cipro   . Other Other (See Comments)    Steroids Hyper sensitive/palpitations  . Asa [Aspirin] Other (See Comments)    Ulcers  . Ibuprofen Other (See Comments)    Ulcers  . Sulfonamide Derivatives Other (See Comments)    Finger nails turn blue    Past Medical History:  Diagnosis Date  . Anal fissure   . Anemia   . Anxiety   . Depression   . GERD (gastroesophageal reflux disease)   . History of weight change   . Hypertension   . Hypertension   . Palpitations   . Peptic ulcer disease   . Scar   . Swelling     Family History  Problem Relation Age of Onset  . Alcoholism Father   . Esophageal cancer Father   . Heart disease Mother   . Kidney disease Mother   . Breast cancer Sister        1/2 sister  . Diabetes Paternal Grandmother   . Heart disease Paternal Grandmother   . Diabetes Paternal Grandfather   . Crohn's disease Cousin   . Crohn's disease Niece   . Colon cancer Neg Hx   . Pancreatic cancer Neg Hx   . Stomach cancer Neg Hx     Social History   Socioeconomic History  . Marital status: Married    Spouse name: Not on file  . Number of children: Not on file  . Years of education: Not on file  . Highest education level: Not on file  Occupational History  . Not on file  Tobacco Use  . Smoking status: Never Smoker  . Smokeless tobacco: Never Used  Substance and Sexual Activity  . Alcohol use: No  . Drug use: No  . Sexual activity: Not on file  Other Topics Concern  . Not on file  Social History Narrative  . Not on file   Social Determinants of Health   Financial Resource Strain:   . Difficulty of Paying Living Expenses:   Food Insecurity:   . Worried About Charity fundraiser in the Last Year:   .  Arboriculturist in the Last Year:   Transportation Needs:   . Film/video editor (Medical):   [name] Lack of Transportation (Non-Medical):   Physical Activity:   . Days of Exercise per Week:   . Minutes of Exercise per Session:   Stress:   . Feeling of Stress :   Social Connections:   . Frequency of Communication with Friends and Family:   . Frequency of Social Gatherings with Friends and Family:   . Attends Religious Services:   .  Active Member of Clubs or Organizations:   . Attends Archivist Meetings:   [name] Marital Status:   Intimate Partner Violence:   . Fear of Current or Ex-Partner:   . Emotionally Abused:   [name] Physically Abused:   . Sexually Abused:     Past Medical History, Surgical history, Social history, and Family history were reviewed and updated as appropriate.   Please see review of systems for further details on the patient's review from today.   Objective:   Physical Exam:  There were no vitals taken for this visit.  Physical Exam Constitutional:      General: She is not in acute distress. Musculoskeletal:        General: No deformity.  Neurological:     Mental Status: She is alert and oriented to person, place, and time.     Coordination: Coordination normal.  Psychiatric:        Attention and Perception: Attention and perception normal. She does not perceive auditory or visual hallucinations.        Mood and Affect: Mood normal. Mood is not anxious or depressed. Affect is not labile, blunt, angry or inappropriate.        Speech: Speech normal.        Behavior: Behavior normal.        Thought Content: Thought content normal. Thought content is not paranoid or delusional. Thought content does not include homicidal or suicidal ideation. Thought content does not include homicidal or suicidal plan.        Cognition and Memory: Cognition and memory normal.        Judgment: Judgment normal.     Comments: Insight intact     Lab Review:      Component Value Date/Time   NA 140 10/14/2007 2047   K 4.0 10/14/2007 2047   CL 104 10/14/2007 2047   CO2 25 10/14/2007 2047   GLUCOSE 96 10/14/2007 2047   BUN 9 10/14/2007 2047   CREATININE 0.68 10/14/2007 2047   CALCIUM 8.8 10/14/2007 2047       Component Value Date/Time   WBC 7.3 10/14/2007 2047   RBC 3.98 10/14/2007 2047   HGB 8.3 (L) 10/14/2007 2047   HCT 30.3 (L) 10/14/2007 2047   PLT 380 10/14/2007 2047   MCV 76.1 (L) 10/14/2007 2047   MCHC 27.4 (L) 10/14/2007 2047   RDW 21.9 (H) 10/14/2007 2047   LYMPHSABS 2.0 10/25/2006 1625   MONOABS 0.5 10/25/2006 1625   EOSABS 0.1 10/25/2006 1625   BASOSABS 0.0 10/25/2006 1625    No results found for: POCLITH, LITHIUM   No results found for: PHENYTOIN, PHENOBARB, VALPROATE, CBMZ   .res Assessment: Plan:    Plan: 1. Continue Lexapro 10mg  daily  2. Continue Xanax 1mg  TID  Note read and reviewed with patient for accuracy.   Continue therapy with [name]  Patient disabled  Discussed potential benefits, risk, and side effects of benzodiazepines to include potential risk of tolerance and dependence, as well as possible drowsiness.  Advised patient not to drive if experiencing drowsiness and to take lowest possible effective dose to minimize risk of dependence and tolerance.    Diagnoses and all orders for this visit:  Generalized anxiety disorder -     escitalopram (LEXAPRO) 10 MG tablet; Take one tablet daily. -     ALPRAZolam (XANAX) 1 MG tablet; Take 1 tablet three times daily prn anxiety  Major depressive disorder, recurrent episode, moderate ([place]) -  escitalopram (LEXAPRO) 10 MG tablet; Take one tablet daily.  Obsessive-compulsive disorder, unspecified type  Panic attacks -     escitalopram (LEXAPRO) 10 MG tablet; Take one tablet daily. -     ALPRAZolam (XANAX) 1 MG tablet; Take 1 tablet three times daily prn anxiety     Please see After Visit Summary for patient specific instructions.  No future  appointments.  No orders of the defined types were placed in this encounter.   -------------------------------

## 2020-04-16 ENCOUNTER — Other Ambulatory Visit: Payer: Self-pay

## 2020-04-16 ENCOUNTER — Ambulatory Visit (INDEPENDENT_AMBULATORY_CARE_PROVIDER_SITE_OTHER): Payer: 59 | Admitting: Psychiatry

## 2020-04-16 DIAGNOSIS — F411 Generalized anxiety disorder: Secondary | ICD-10-CM

## 2020-04-16 NOTE — Progress Notes (Deleted)
[facility] Counselor/Therapist Progress Note  Patient ID: Sherry Middleton, MRN: [id number],    Date: 04/16/2020  Time Spent: 51 minutes start time 9:08 AM end time 9:58 AM   Treatment Type: Individual Therapy  Reported Symptoms: anxiety, sleep issues, overwhelmed, fatigue, focusing issues, depression, Panic, difficulty completing tasks  Mental Status Exam:  Appearance:   Well Groomed     Behavior:  Appropriate  Motor:  Normal  Speech/Language:   Normal Rate  Affect:  Appropriate  Mood:  normal  Thought process:  normal  Thought content:    WNL  Sensory/Perceptual disturbances:    WNL  Orientation:  oriented to person, place, time/date and situation  Attention:  Good  Concentration:  Good  Memory:  WNL  Fund of knowledge:   Good  Insight:    Good  Judgment:   Good  Impulse Control:  Good   Risk Assessment: Danger to Self:  No Self-injurious Behavior: No Danger to Others: No Duty to Warn:no Physical Aggression / Violence:No  Access to Firearms a concern: No  Gang Involvement:No   Subjective:  Patient was present for session.  She has been doing better the past few weeks with her stomach.  She went on to share that since last session her puppy got hurt and have had several vet visits that have been very costly.  They have been trying to get her still to be better and it has been very stressful. Her sister in law that lives with her contracted an infection in her foot and has been in the hospital and is on IV antibiotics and she has to pack her foot everyday. Her very close friend has cancer and has been in the hospital. Her husband started having more emotional issues and he is now trying to do better, but it has all been on her and she is overwhelmed.  She shared she tried to taper off her medication but realized that was not an option.  She went on to share that she is seeing how much she struggles with concentration and staying on task. She is also having trouble  with a schedule due to all the doctors' appointments.  Encouraged her to start writing down goals for the day at night.  She was also encouraged to write out the list of things that have to be accomplished in a week and talk to her husband and her sister-in-law about which items they can help her do so she is not feeling like she is all on her own with the situation.  Patient was encouraged to work on her diet and exercise.  Patient was also encouraged to continue taking things 1 step at a time and focusing on the things she can control fix and change and reminding herself she needs to have some quiet time each day since in the past she is responded well to that.  Interventions: Cognitive Behavioral Therapy and Solution-Oriented/Positive Psychology  Diagnosis:   ICD-10-CM   1. Generalized anxiety disorder  F41.1     Plan: Patient is to start each day with quiet time to get grounded.  Patient is to use CBT and coping skills to decrease anxiety symptoms. Patient is to try and work on setting small goals for each day.  Long term goal: Resolve the core conflict that is the source of anxiety Short term goal: Identify the major life conflicts from the past and present that form the basis for present anxiety.  Increase understanding of beliefs and  messages that produce the worry and anxiety  [name], [facility]

## 2020-04-16 NOTE — Progress Notes (Signed)
[facility] Counselor/Therapist Progress Note  Patient ID: Sherry Middleton, MRN: [id number],    Date: 04/16/2020  Time Spent: 51 minutes start time 9:08 AM end time 9:58 AM   Treatment Type: Individual Therapy  Reported Symptoms: anxiety, sleep issues, overwhelmed, fatigue, focusing issues, depression, Panic, difficulty completing tasks  Mental Status Exam:  Appearance:   Well Groomed     Behavior:  Appropriate  Motor:  Normal  Speech/Language:   Normal Rate  Affect:  Appropriate  Mood:  normal  Thought process:  normal  Thought content:    WNL  Sensory/Perceptual disturbances:    WNL  Orientation:  oriented to person, place, time/date and situation  Attention:  Good  Concentration:  Good  Memory:  WNL  Fund of knowledge:   Good  Insight:    Good  Judgment:   Good  Impulse Control:  Good   Risk Assessment: Danger to Self:  No Self-injurious Behavior: No Danger to Others: No Duty to Warn:no Physical Aggression / Violence:No  Access to Firearms a concern: No  Gang Involvement:No   Subjective:  Patient was present for session.  She has been doing better the past few weeks with her stomach.  She went on to share that since last session her puppy got hurt and have had several vet visits that have been very costly.  They have been trying to get her still to be better and it has been very stressful. Her sister in law that lives with her contracted an infection in her foot and has been in the hospital and is on IV antibiotics and she has to pack her foot everyday. Her very close friend has cancer and has been in the hospital. Her husband started having more emotional issues and he is now trying to do better, but it has all been on her and she is overwhelmed.  She shared she tried to taper off her medication but realized that was not an option.  She went on to share that she is seeing how much she struggles with concentration and staying on task. She is also having trouble  with a schedule due to all the doctors' appointments.  Encouraged her to start writing down goals for the day at night.  She was also encouraged to write out the list of things that have to be accomplished in a week and talk to her husband and her sister-in-law about which items they can help her do so she is not feeling like she is all on her own with the situation.  Patient was encouraged to work on her diet and exercise.  Patient was also encouraged to continue taking things 1 step at a time and focusing on the things she can control fix and change and reminding herself she needs to have some quiet time each day since in the past she is responded well to that.  Interventions: Cognitive Behavioral Therapy and Solution-Oriented/Positive Psychology  Diagnosis:   ICD-10-CM   1. Generalized anxiety disorder  F41.1     Plan: Patient is to start each day with quiet time to get grounded.  Patient is to use CBT and coping skills to decrease anxiety symptoms. Patient is to try and work on setting small goals for each day.  Long term goal: Resolve the core conflict that is the source of anxiety Short term goal: Identify the major life conflicts from the past and present that form the basis for present anxiety.  Increase understanding of beliefs and  messages that produce the worry and anxiety  [name], [facility]

## 2020-04-27 ENCOUNTER — Ambulatory Visit: Payer: Self-pay | Admitting: Sports Medicine

## 2020-04-28 ENCOUNTER — Ambulatory Visit (INDEPENDENT_AMBULATORY_CARE_PROVIDER_SITE_OTHER): Payer: 59

## 2020-04-28 ENCOUNTER — Other Ambulatory Visit: Payer: Self-pay

## 2020-04-28 ENCOUNTER — Other Ambulatory Visit: Payer: Self-pay | Admitting: Sports Medicine

## 2020-04-28 ENCOUNTER — Encounter: Payer: Self-pay | Admitting: Sports Medicine

## 2020-04-28 ENCOUNTER — Ambulatory Visit (INDEPENDENT_AMBULATORY_CARE_PROVIDER_SITE_OTHER): Payer: 59 | Admitting: Sports Medicine

## 2020-04-28 DIAGNOSIS — M069 Rheumatoid arthritis, unspecified: Secondary | ICD-10-CM

## 2020-04-28 DIAGNOSIS — M79672 Pain in left foot: Secondary | ICD-10-CM

## 2020-04-28 DIAGNOSIS — M79671 Pain in right foot: Secondary | ICD-10-CM

## 2020-04-28 DIAGNOSIS — Z8776 Personal history of (corrected) congenital malformations of integument, limbs and musculoskeletal system: Secondary | ICD-10-CM | POA: Diagnosis not present

## 2020-04-28 DIAGNOSIS — Z87768 Personal history of other specified (corrected) congenital malformations of integument, limbs and musculoskeletal system: Secondary | ICD-10-CM

## 2020-04-28 DIAGNOSIS — M779 Enthesopathy, unspecified: Secondary | ICD-10-CM

## 2020-04-28 DIAGNOSIS — M19079 Primary osteoarthritis, unspecified ankle and foot: Secondary | ICD-10-CM

## 2020-04-28 NOTE — Progress Notes (Signed)
Subjective: Sherry Middleton is a 54 y.o. female patient who presents to office for evaluation of right and left foot pain. Patient complains of progressive pain especially over the last month in the right foot on the lateral side that she is most concerned about since his pain is new admits to a history of chronic foot pain and foot problems was diagnosed with clubfoot and had surgery when she was a few weeks old so always has foot pain in general but the right foot pain feels different or new.  Reports that pain is intensified with range of motion or with weightbearing to the right foot states that there is a low-grade throbbing but when she walks or stands or is on her foot for an extended amount of time end up having a lot of pain.  Denies injury/trip/fall/sprain/any causative factors.   Review of systems noncontributory  Patient Active Problem List   Diagnosis Date Noted  . Abdominal pain, epigastric   . Dysphagia   . Gastroesophageal reflux disease   . Bloating   . Change in bowel habit   . Murmur, cardiac 04/13/2016  . Morbid (severe) obesity due to excess calories ([place]) 04/13/2016  . Benign essential hypertension 04/13/2016  . Asthma 09/01/2015  . S/P repair of ventral hernia 09/01/2015  . Fibroids 03/30/2015  . Impingement syndrome of right shoulder 12/29/2014  . Generalized anxiety disorder 02/08/2013  . Major depressive disorder, recurrent episode ([place]) 02/08/2013  . Housing or economic circumstance 06/18/2012  . VIRAL GASTROENTERITIS 10/25/2006  . VIRAL URI 10/25/2006  . ANEMIA-IRON DEFICIENCY 09/05/2006  . OBESITY 06/07/2006  . HYPERTENSION 06/07/2006  . METRORRHAGIA 06/07/2006  . LATERAL EPICONDYLITIS 06/07/2006    Current Outpatient Medications on File Prior to Visit  Medication Sig Dispense Refill  . ALPRAZolam (XANAX) 1 MG tablet Take 1 tablet three times daily prn anxiety 90 tablet 2  . ascorbic acid (VITAMIN C) 500 MG tablet Take by mouth.    . colestipol  (COLESTID) 1 g tablet Take 1 tablet (1 gram) by mouth once to twice a day as needed 60 tablet 3  . dicyclomine (BENTYL) 10 MG capsule Take 1-2 capsules (10-20 mg total) by mouth every 8 (eight) hours as needed for spasms. 90 capsule 1  . DUEXIS 800-26.6 MG TABS Take 1 tablet by mouth every 8 (eight) hours as needed (Pain).     [name] EPINEPHrine 0.3 mg/0.3 mL IJ SOAJ injection SMARTSIG:1 Pre-Filled Pen Syringe IM PRN    . escitalopram (LEXAPRO) 10 MG tablet Take one tablet daily. 30 tablet 5  . furosemide (LASIX) 20 MG tablet Take 20 mg by mouth daily.     [name] lisinopril (ZESTRIL) 20 MG tablet Take 20 mg by mouth daily.     [name] lisinopril-hydrochlorothiazide (PRINZIDE,ZESTORETIC) 20-12.5 MG per tablet Take 1 tablet by mouth daily.     [name] omeprazole (PRILOSEC) 40 MG capsule Take 1 capsule (40 mg total) by mouth 2 (two) times daily. 90 capsule 3  . ondansetron (ZOFRAN ODT) 4 MG disintegrating tablet Take 1 tablet (4 mg total) by mouth every 6 (six) hours as needed for nausea or vomiting. 90 tablet 3  . polyethylene glycol powder (GLYCOLAX/MIRALAX) 17 GM/SCOOP powder Take 0.5 Containers by mouth daily as needed for mild constipation or moderate constipation. As needed    . Probiotic Product (ALIGN) 4 MG CAPS Take 4 mg by mouth daily.    . Scar GEL Apply 1 application topically daily as needed (Scar). As needed    .  spironolactone (ALDACTONE) 25 MG tablet Take 25 mg by mouth daily.     [name] triamcinolone cream (KENALOG) 0.1 % Apply topically 2 (two) times daily.    . Vitamin D, Ergocalciferol, (DRISDOL) 1.25 MG (50000 UT) CAPS capsule Take 50,000 Units by mouth 2 (two) times a week.     . Wheat Dextrin (BENEFIBER DRINK MIX PO) Take 1 Package by mouth daily as needed (constipation).     No current facility-administered medications on file prior to visit.    Allergies  Allergen Reactions  . Levaquin [Levofloxacin In D5w] Shortness Of Breath    avalox and cipro   . Other Other (See Comments)    Steroids Hyper  sensitive/palpitations  . Asa [Aspirin] Other (See Comments)    Ulcers  . Ibuprofen Other (See Comments)    Ulcers  . Sulfonamide Derivatives Other (See Comments)    Finger nails turn blue    Objective:  General: Alert and oriented x3 in no acute distress  Dermatology: No open lesions bilateral lower extremities, no webspace macerations, no ecchymosis bilateral, all nails x 10 are well manicured.  Old surgical scars well-healed bilateral.  Vascular: Dorsalis Pedis and Posterior Tibial pedal pulses difficult to palpate due to body habitus, Capillary Fill Time 3 seconds,(+) pedal hair growth bilateral, chronic fat versus lymphedema bilateral lower extremities, Temperature gradient within normal limits.  Neurology: Johney Maine sensation intact via light touch bilateral.  Musculoskeletal: Increased tenderness palpation to peroneal tendon course at the right lateral foot, there is mild varus clubfoot deformity history of clubfoot as a child.  There is limited range of motion bilateral ankle and midfoot with lesser digital contractures and hallux rigidus noted bilateral.  Gait: Antalgic gait  Xrays  Right and left foot   Impression: Diffuse arthritis to both feet, there is significant calcifications noted in the Achilles tendon of the right, there is significant digital and forefoot deformity noted bilateral, there is significant soft tissue swelling versus increased density due to body habitus  Assessment and Plan: Problem List Items Addressed This Visit      Other   Morbid (severe) obesity due to excess calories ([place])    Other Visit Diagnoses    Rheumatoid arthritis involving both feet, unspecified whether rheumatoid factor present ([place])    -  Primary   Relevant Orders   Uric acid   Sedimentation rate   C-reactive protein   Rheumatoid factor   HLA-B27 antigen   CBC with Differential/Platelet   ANA, IFA (with reflex)   Tendinitis       Foot pain, bilateral       History of clubfoot        Osteoarthritis of ankle and foot, unspecified laterality           -Complete examination performed -Xrays reviewed -Discussed treatement options for chronic foot pain with likely new acute tendinitis/compensation on the right -Rx arthritic panel for further evaluation of possible systemic or underlying cause for symptoms -Recommend patient to get over-the-counter topical Voltaren to try to areas of pain -Dispensed ankle gauntlet to use on the right to decrease stress to the peroneal tendon course -No steroids given by mouth or injection due to allergies and history of heart flutter/palpitations.  No NSAIDs given by mouth due to allergies and history of IBS -Patient to return to office after blood work or sooner if condition worsens.  Landis Martins, DPM

## 2020-04-29 ENCOUNTER — Encounter: Payer: Self-pay | Admitting: Sports Medicine

## 2020-05-03 ENCOUNTER — Other Ambulatory Visit: Payer: Self-pay | Admitting: Sports Medicine

## 2020-05-03 DIAGNOSIS — M069 Rheumatoid arthritis, unspecified: Secondary | ICD-10-CM

## 2020-05-04 ENCOUNTER — Telehealth: Payer: Self-pay | Admitting: Adult Health

## 2020-05-04 ENCOUNTER — Other Ambulatory Visit: Payer: Self-pay

## 2020-05-04 DIAGNOSIS — F411 Generalized anxiety disorder: Secondary | ICD-10-CM

## 2020-05-04 DIAGNOSIS — F410 Panic disorder [episodic paroxysmal anxiety] without agoraphobia: Secondary | ICD-10-CM

## 2020-05-04 MED ORDER — ALPRAZOLAM 1 MG PO TABS: ORAL_TABLET | ORAL | 2 refills | Status: DC

## 2020-05-04 NOTE — Telephone Encounter (Signed)
Can we send in a RF to be on file for pt for Xanax to [facility] on [address].  APPT 9/7

## 2020-05-04 NOTE — Telephone Encounter (Signed)
Last refill 04/09/2020 Pended for Barnett Applebaum to send

## 2020-05-07 ENCOUNTER — Ambulatory Visit: Payer: Self-pay | Admitting: Psychiatry

## 2020-05-11 ENCOUNTER — Other Ambulatory Visit: Payer: Self-pay

## 2020-05-11 ENCOUNTER — Ambulatory Visit (INDEPENDENT_AMBULATORY_CARE_PROVIDER_SITE_OTHER): Payer: 59 | Admitting: Adult Health

## 2020-05-11 ENCOUNTER — Encounter: Payer: Self-pay | Admitting: Adult Health

## 2020-05-11 DIAGNOSIS — F429 Obsessive-compulsive disorder, unspecified: Secondary | ICD-10-CM | POA: Diagnosis not present

## 2020-05-11 DIAGNOSIS — F411 Generalized anxiety disorder: Secondary | ICD-10-CM | POA: Diagnosis not present

## 2020-05-11 DIAGNOSIS — F331 Major depressive disorder, recurrent, moderate: Secondary | ICD-10-CM | POA: Diagnosis not present

## 2020-05-11 DIAGNOSIS — F410 Panic disorder [episodic paroxysmal anxiety] without agoraphobia: Secondary | ICD-10-CM | POA: Diagnosis not present
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
## 2020-05-13 ENCOUNTER — Telehealth: Payer: Self-pay | Admitting: Sports Medicine

## 2020-05-13 NOTE — Telephone Encounter (Signed)
I dont see any blood work results. Please have the patient to contact the lab or the place where she got the blood work done to have them send it to me. I dont see any results Thanks Dr. Chauncey Cruel

## 2020-05-13 NOTE — Telephone Encounter (Signed)
Pt called about her test results and wanted to go over them with you. She said she is in lots of pain and wanted to know the plan going forward.

## 2020-05-14 NOTE — Telephone Encounter (Signed)
FYI I discussed the results with patient. CRP elevated. Advised patient that if pain continues may come in for CAM boot or for kenalog inj if she can tolerate that

## 2020-05-14 NOTE — Telephone Encounter (Signed)
Called pt gave fax number. She will have [facility] and the hospital to fax over lab results. Told pt after doctor reviewed results either Dr. Cannon Kettle will call and talk with pt about them or you will let me know to schedule her an appt to come and see you.Thanks

## 2020-05-14 NOTE — Telephone Encounter (Signed)
Sherry Middleton, Can you contact pt and give her our [place] fax number please and thank you.  Pt is to contact the lab where she got her blood work done to send [place] the results.

## 2020-05-21 ENCOUNTER — Ambulatory Visit (INDEPENDENT_AMBULATORY_CARE_PROVIDER_SITE_OTHER): Payer: 59 | Admitting: Psychiatry

## 2020-05-21 DIAGNOSIS — F411 Generalized anxiety disorder: Secondary | ICD-10-CM | POA: Diagnosis not present
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
## 2020-06-04 ENCOUNTER — Ambulatory Visit (INDEPENDENT_AMBULATORY_CARE_PROVIDER_SITE_OTHER): Payer: 59 | Admitting: Psychiatry

## 2020-06-04 DIAGNOSIS — F411 Generalized anxiety disorder: Secondary | ICD-10-CM

## 2020-06-04 NOTE — Progress Notes (Signed)
[facility] Counselor/Therapist Progress Note  Patient ID: Sherry Middleton, MRN: [id number],    Date: 06/04/2020  Time Spent: 50 minutes start time 11:08 end time 11:58 AM Virtual Visit via Telephone Note Connected with patient by a video enabled telemedicine/telehealth application or telephone, with their informed consent, and verified patient privacy and that I am speaking with the correct person using two identifiers. I discussed the limitations, risks, security and privacy concerns of performing psychotherapy and management service by telephone and the availability of in person appointments. I also discussed with the patient that there may be a patient responsible charge related to this service. The patient expressed understanding and agreed to proceed. I discussed the treatment planning with the patient. The patient was provided an opportunity to ask questions and all were answered. The patient agreed with the plan and demonstrated an understanding of the instructions. The patient was advised to call  our office if  symptoms worsen or feel they are in a crisis state and need immediate contact.   Therapist Location: office Patient Location: home    Treatment Type: Individual Therapy  Reported Symptoms: sadness, anxiety, triggered responses, grief issues, panic, shaky, sleep issues, crying spells, health issues, hopelessness, helplessness  Mental Status Exam:  Appearance:   NA     Behavior:  Sharing  Motor:  na  Speech/Language:   Normal Rate  Affect:  NA  Mood:  sad  Thought process:  normal  Thought content:    WNL  Sensory/Perceptual disturbances:    WNL  Orientation:  oriented to person, place, time/date and situation  Attention:  Good  Concentration:  Good  Memory:  WNL  Fund of knowledge:   Good  Insight:    Good  Judgment:   Good  Impulse Control:  Good   Risk Assessment: Danger to Self:  No Self-injurious Behavior: No Danger to Others: No Duty to  Warn:no Physical Aggression / Violence:No  Access to Firearms a concern: No  Gang Involvement:No   Subjective: Met with patient via phone.  She shared that her Internet was down and so she had to do a phone session.  She went on to share that her daughter in law that was in the hospital had passed away on the 2022-10-29 of 20-May-2023. She shared that her son is not doing well because they had been together so long.  Also, patient considered her a daughter to her.  She has had to try to be there for her son and grandchildren through the process. Patient shared she is not sure how she is going to be able to pay her bills. She was tearful as she explained that she has always been able to get things worked out and provide for her family and now she feels like a failure because she can't physically go to work.  Patient was reminded that she isn't a failure because it isn't her fault that she can't work currently. She also has had a close friend put into Hospice home and she is not doing well so she is going to try and go see her today. Patient shared her swelling and pain are making it hard for her to get anything done at her home.  She shared she is going to try and take a pain pill to see if she can get through doing something at her home. Patient was encouraged to try and figure out different options to help her get through the difficult. She was encouraged to  focus on the strengths that are there rather than just the problems.  Patient was able to identify different sources that she can ask for assistance and guidance.  Encouraged her to consult with those people.  Also encouraged her to continue looking at the ways that things have worked out and what can still happen in the situation.  Patient was reminded that taking her time in the morning to ground herself is seem to help with the anxiety in the past and encouraged to continue to do that.  Interventions: Solution-Oriented/Positive Psychology  Diagnosis:    ICD-10-CM   1. Generalized anxiety disorder  F41.1     Plan: Patient is to use CBT and coping skills to decrease anxiety symptoms.  Patient is to follow through on plans from session to seek different options to try and get some resources during this difficult situation with her finances.  Patient was reminded to make sure to start each day with her quiet time so she can get grounded and deal with what ever seems to come up during the day. Long term goal: Resolve the core conflict that in the source of anxiety Short term goal: Identify the major life conflicts from the part and present  That form the basis for present anxiety.  Increase understanding of beliefs and messages that worry and anxiety  [name], [facility]

## 2020-06-12 ENCOUNTER — Other Ambulatory Visit: Payer: Self-pay | Admitting: Physician Assistant

## 2020-06-18 ENCOUNTER — Other Ambulatory Visit: Payer: Self-pay

## 2020-06-18 ENCOUNTER — Ambulatory Visit (INDEPENDENT_AMBULATORY_CARE_PROVIDER_SITE_OTHER): Payer: 59 | Admitting: Psychiatry

## 2020-06-18 DIAGNOSIS — F411 Generalized anxiety disorder: Secondary | ICD-10-CM
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
## 2020-06-25 ENCOUNTER — Ambulatory Visit: Payer: 59 | Admitting: Sports Medicine

## 2020-07-13 ENCOUNTER — Encounter: Payer: Self-pay | Admitting: Adult Health

## 2020-07-13 ENCOUNTER — Telehealth (INDEPENDENT_AMBULATORY_CARE_PROVIDER_SITE_OTHER): Payer: 59 | Admitting: Adult Health

## 2020-07-13 DIAGNOSIS — G4700 Insomnia, unspecified: Secondary | ICD-10-CM

## 2020-07-13 DIAGNOSIS — F410 Panic disorder [episodic paroxysmal anxiety] without agoraphobia: Secondary | ICD-10-CM

## 2020-07-13 DIAGNOSIS — F411 Generalized anxiety disorder: Secondary | ICD-10-CM | POA: Diagnosis not present

## 2020-07-13 DIAGNOSIS — F331 Major depressive disorder, recurrent, moderate: Secondary | ICD-10-CM

## 2020-07-13 MED ORDER — ALPRAZOLAM 1 MG PO TABS: ORAL_TABLET | ORAL | 2 refills | Status: DC

## 2020-07-13 MED ORDER — ESCITALOPRAM OXALATE 10 MG PO TABS: ORAL_TABLET | ORAL | 5 refills | Status: DC

## 2020-07-13 NOTE — Progress Notes (Signed)
Sherry Middleton [id number] 1966-06-19 54 y.o.  Subjective:   Patient ID:  Sherry Middleton is a 54 y.o. (DOB February 19, 1966) female.  Chief Complaint: No chief complaint on file.   HPI Sherry Middleton presents to the office today for follow-up of depression, anxiety, panic attacks, and insomnia.   Describes mood today as "not good". Pleasant. Tearful at times. Mood symptoms - reports increased depression. Is on the verge of tears. Reports anxiety and irritability. Reports panic attacks. Stating "I'm just not doing well". Hard time of the year. Mind racing in different days - "numerous thoughts at once". Doesn't want to celebrate the holidays. Husband "falling" - 3 falls over the past week. Worried about son that is having anxiety and depression. Feels like a prisoner in her home. Struggling financially. Read journal and thoughts from Sunday. Stating "things are overwhelming". Wanting to increase Lexapro 10mg  to 15mg  daily. Chronic pain issues. Husband with medical issues. Seeing therapist - [name]. Varying interest and motivation. Taking medications as prescribed. Energy levels low. Active, does not have a regular exercise routine with physical disabilities.  Enjoys some usual interests and activities. Married. Lives with husband of 26 years. Spending time with family - 2 sons and their family.    Appetite adequate.Reports IBS flare-ups. Weight fluctuates.  Sleeping difficulties - "some nights are better than others". Averages 7 hours - usually broken sleep.  Focus and concentration difficulties at times. Completing tasks. Managing some aspects of household - cleaning.  Denies SI or HI. Denies AH or VH.  Review of Systems:  Review of Systems  Musculoskeletal: Negative for gait problem.  Neurological: Negative for tremors.  Psychiatric/Behavioral:       Please refer to HPI    Medications: I have reviewed the patient's current medications.  Current Outpatient Medications   Medication Sig Dispense Refill  . ALPRAZolam (XANAX) 1 MG tablet Take 1 tablet three times daily prn anxiety 90 tablet 2  . ascorbic acid (VITAMIN C) 500 MG tablet Take by mouth.    . colestipol (COLESTID) 1 g tablet Take 1 tablet (1 gram) by mouth once to twice a day as needed 60 tablet 3  . dicyclomine (BENTYL) 10 MG capsule Take 1-2 capsules (10-20 mg total) by mouth every 8 (eight) hours as needed for spasms. 90 capsule 1  . DUEXIS 800-26.6 MG TABS Take 1 tablet by mouth every 8 (eight) hours as needed (Pain).     [name] EPINEPHrine 0.3 mg/0.3 mL IJ SOAJ injection SMARTSIG:1 Pre-Filled Pen Syringe IM PRN    . escitalopram (LEXAPRO) 10 MG tablet Take one and 1/2 tablets daily. 45 tablet 5  . furosemide (LASIX) 20 MG tablet Take 20 mg by mouth daily.     [name] ibuprofen (ADVIL) 800 MG tablet Take 800 mg by mouth 3 (three) times daily as needed.    [name] lisinopril (ZESTRIL) 20 MG tablet Take 20 mg by mouth daily.     [name] lisinopril-hydrochlorothiazide (PRINZIDE,ZESTORETIC) 20-12.5 MG per tablet Take 1 tablet by mouth daily.     [name] omeprazole (PRILOSEC) 40 MG capsule TAKE 1 CAPSULE(40 MG) BY MOUTH TWICE DAILY 180 capsule 2  . ondansetron (ZOFRAN ODT) 4 MG disintegrating tablet Take 1 tablet (4 mg total) by mouth every 6 (six) hours as needed for nausea or vomiting. 90 tablet 3  . polyethylene glycol powder (GLYCOLAX/MIRALAX) 17 GM/SCOOP powder Take 0.5 Containers by mouth daily as needed for mild constipation or moderate constipation. As needed    . Probiotic Product (ALIGN) 4 MG  CAPS Take 4 mg by mouth daily.    . Scar GEL Apply 1 application topically daily as needed (Scar). As needed    . spironolactone (ALDACTONE) 25 MG tablet Take 25 mg by mouth daily.     [name] triamcinolone cream (KENALOG) 0.1 % Apply topically 2 (two) times daily.    . Vitamin D, Ergocalciferol, (DRISDOL) 1.25 MG (50000 UT) CAPS capsule Take 50,000 Units by mouth 2 (two) times a week.     . Wheat Dextrin (BENEFIBER DRINK MIX PO) Take 1  Package by mouth daily as needed (constipation).     No current facility-administered medications for this visit.    Medication Side Effects: None  Allergies:  Allergies  Allergen Reactions  . Levaquin [Levofloxacin In D5w] Shortness Of Breath    avalox and cipro   . Other Other (See Comments)    Steroids Hyper sensitive/palpitations  . Asa [Aspirin] Other (See Comments)    Ulcers  . Ibuprofen Other (See Comments)    Ulcers  . Sulfonamide Derivatives Other (See Comments)    Finger nails turn blue    Past Medical History:  Diagnosis Date  . Anal fissure   . Anemia   . Anxiety   . Depression   . GERD (gastroesophageal reflux disease)   . History of weight change   . Hypertension   . Hypertension   . Palpitations   . Peptic ulcer disease   . Scar   . Swelling     Family History  Problem Relation Age of Onset  . Alcoholism Father   . Esophageal cancer Father   . Heart disease Mother   . Kidney disease Mother   . Breast cancer Sister        1/2 sister  . Diabetes Paternal Grandmother   . Heart disease Paternal Grandmother   . Diabetes Paternal Grandfather   . Crohn's disease Cousin   . Crohn's disease Niece   . Colon cancer Neg Hx   . Pancreatic cancer Neg Hx   . Stomach cancer Neg Hx     Social History   Socioeconomic History  . Marital status: Married    Spouse name: Not on file  . Number of children: Not on file  . Years of education: Not on file  . Highest education level: Not on file  Occupational History  . Not on file  Tobacco Use  . Smoking status: Never Smoker  . Smokeless tobacco: Never Used  Vaping Use  . Vaping Use: Never used  Substance and Sexual Activity  . Alcohol use: No  . Drug use: No  . Sexual activity: Not on file  Other Topics Concern  . Not on file  Social History Narrative  . Not on file   Social Determinants of Health   Financial Resource Strain:   . Difficulty of Paying Living Expenses: Not on file  Food  Insecurity:   . Worried About Charity fundraiser in the Last Year: Not on file  . Ran Out of Food in the Last Year: Not on file  Transportation Needs:   . Lack of Transportation (Medical): Not on file  . Lack of Transportation (Non-Medical): Not on file  Physical Activity:   . Days of Exercise per Week: Not on file  . Minutes of Exercise per Session: Not on file  Stress:   . Feeling of Stress : Not on file  Social Connections:   . Frequency of Communication with Friends and Family: Not on file  .  Frequency of Social Gatherings with Friends and Family: Not on file  . Attends Religious Services: Not on file  . Active Member of Clubs or Organizations: Not on file  . Attends Archivist Meetings: Not on file  . Marital Status: Not on file  Intimate Partner Violence:   . Fear of Current or Ex-Partner: Not on file  . Emotionally Abused: Not on file  . Physically Abused: Not on file  . Sexually Abused: Not on file    Past Medical History, Surgical history, Social history, and Family history were reviewed and updated as appropriate.   Please see review of systems for further details on the patient's review from today.   Objective:   Physical Exam:  There were no vitals taken for this visit.  Physical Exam Constitutional:      General: She is not in acute distress. Musculoskeletal:        General: No deformity.  Neurological:     Mental Status: She is alert and oriented to person, place, and time.     Coordination: Coordination normal.  Psychiatric:        Attention and Perception: Attention and perception normal. She does not perceive auditory or visual hallucinations.        Mood and Affect: Mood normal. Mood is not anxious or depressed. Affect is not labile, blunt, angry or inappropriate.        Speech: Speech normal.        Behavior: Behavior normal.        Thought Content: Thought content normal. Thought content is not paranoid or delusional. Thought content does  not include homicidal or suicidal ideation. Thought content does not include homicidal or suicidal plan.        Cognition and Memory: Cognition and memory normal.        Judgment: Judgment normal.     Comments: Insight intact     Lab Review:     Component Value Date/Time   NA 140 10/14/2007 2047   K 4.0 10/14/2007 2047   CL 104 10/14/2007 2047   CO2 25 10/14/2007 2047   GLUCOSE 96 10/14/2007 2047   BUN 9 10/14/2007 2047   CREATININE 0.68 10/14/2007 2047   CALCIUM 8.8 10/14/2007 2047       Component Value Date/Time   WBC 7.3 10/14/2007 2047   RBC 3.98 10/14/2007 2047   HGB 8.3 (L) 10/14/2007 2047   HCT 30.3 (L) 10/14/2007 2047   PLT 380 10/14/2007 2047   MCV 76.1 (L) 10/14/2007 2047   MCHC 27.4 (L) 10/14/2007 2047   RDW 21.9 (H) 10/14/2007 2047   LYMPHSABS 2.0 10/25/2006 1625   MONOABS 0.5 10/25/2006 1625   EOSABS 0.1 10/25/2006 1625   BASOSABS 0.0 10/25/2006 1625    No results found for: POCLITH, LITHIUM   No results found for: PHENYTOIN, PHENOBARB, VALPROATE, CBMZ   .res Assessment: Plan:    Plan: 1. Increase Lexapro 10mg  to 15mg  daily  2. Continue Xanax 1mg  TID  Continue therapy with [name]  Patient disabled and unable to work.  Discussed potential benefits, risk, and side effects of benzodiazepines to include potential risk of tolerance and dependence, as well as possible drowsiness.  Advised patient not to drive if experiencing drowsiness and to take lowest possible effective dose to minimize risk of dependence and tolerance.  RTC 4/6 weeks   Diagnoses and all orders for this visit:  Generalized anxiety disorder -     escitalopram (LEXAPRO) 10 MG tablet; Take  one and 1/2 tablets daily. -     ALPRAZolam (XANAX) 1 MG tablet; Take 1 tablet three times daily prn anxiety  Major depressive disorder, recurrent episode, moderate (HCC) -     escitalopram (LEXAPRO) 10 MG tablet; Take one and 1/2 tablets daily.  Panic attacks -     escitalopram  (LEXAPRO) 10 MG tablet; Take one and 1/2 tablets daily. -     ALPRAZolam (XANAX) 1 MG tablet; Take 1 tablet three times daily prn anxiety  Insomnia, unspecified type     Please see After Visit Summary for patient specific instructions.  Future Appointments  Date Time Provider [place]  08/06/2020 12:00 PM [name], [facility] CP-CP None  08/10/2020 11:20 AM Mikala Podoll, Berdie Ogren, NP CP-CP None  08/19/2020 11:00 AM [name], [facility] CP-CP None  09/02/2020 11:00 AM [name], [facility] CP-CP None    No orders of the defined types were placed in this encounter.   -------------------------------

## 2020-08-06 ENCOUNTER — Ambulatory Visit (INDEPENDENT_AMBULATORY_CARE_PROVIDER_SITE_OTHER): Payer: 59 | Admitting: Psychiatry

## 2020-08-06 DIAGNOSIS — F411 Generalized anxiety disorder: Secondary | ICD-10-CM | POA: Diagnosis not present

## 2020-08-06 NOTE — Progress Notes (Signed)
    [facility] Counselor/Therapist Progress Note  Patient ID: Sherry Middleton, MRN: [id number],    Date: 08/06/2020  Time Spent: 52 minutes start time 12:04 PM end time 12:56 PM Virtual Visit via Telephone Note Connected with patient by a video enabled telemedicine/telehealth application , with their informed consent, and verified patient privacy and that I am speaking with the correct person using two identifiers. I discussed the limitations, risks, security and privacy concerns of performing psychotherapy and management service by telephone and the availability of in person appointments. I also discussed with the patient that there may be a patient responsible charge related to this service. The patient expressed understanding and agreed to proceed. I discussed the treatment planning with the patient. The patient was provided an opportunity to ask questions and all were answered. The patient agreed with the plan and demonstrated an understanding of the instructions. The patient was advised to call  our office if  symptoms worsen or feel they are in a crisis state and need immediate contact.   Therapist Location: office Patient Location: home    Treatment Type: Individual Therapy  Reported Symptoms: anxiety, sadness, grief issues, sleep issues, focusing issues, overwhelmed, panic attacks, crying spells, low motivation  Mental Status Exam:  Appearance:   Casual and Neat     Behavior:  Appropriate  Motor:  Normal  Speech/Language:   Normal Rate  Affect:  Appropriate  Mood:  normal  Thought process:  normal  Thought content:    WNL  Sensory/Perceptual disturbances:    WNL  Orientation:  oriented to person, place, time/date and situation  Attention:  Good  Concentration:  Good  Memory:  WNL  Fund of knowledge:   Good  Insight:    Good  Judgment:   Good  Impulse Control:  Good   Risk Assessment: Danger to Self:  No Self-injurious Behavior: No Danger to Others: No Duty to  Warn:no Physical Aggression / Violence:No  Access to Firearms a concern: No  Gang Involvement:No   Subjective: Met with patient via virtual session.  She shared she made it through Thanksgiving but it was hard due to missing her daughter in law.  She shared her sleep is off and she is not able to get to bed until around 3 or 4 in the middle of the night and sleeping until 9 or 10. Patient shared that today is her mother's Birthday so she is missing her. She shared she is overwhelmed with everything going on with her and her family.  She  Shared her panic attacks are daily and she can't figure out what to do.  She shared that she hurts everyday and she is going to a doctor on the issue. She shared that she has always worked and made the income so not having money for gas and knowing it is because she isn't working is overwhelming. She shared she is not wanting to do the Holidays because it is too much. The pain of loosing her daughter in law has been hard for her to accept and deal with by patient's report. She shared she is starting to journal again which is good. She also lost her best friend a few weeks after her daughter in law died so her support system has all passed away.  She was trying to prioritize thinking about the things she can control as discussed in session which has helped some. She went on to share she wants to get on a better sleep schedule discussed what   was keeping her from doing that and developed a plan to try help with that goal. She shared it is hard for her not to be able to buy Christmas gifts for her children but she can't.  She was encouraged to find small things she may be able to do for them.  Patient was also encouraged to try and focus on the positive things coming up and to find ways to keep her brain engaged in positive activity.  Patient was encouraged to start each day with quiet time since she is reported that helps her and to take time for herself to relax and breathe as  needed.  Interventions: Cognitive Behavioral Therapy and Solution-Oriented/Positive Psychology  Diagnosis:   ICD-10-CM   1. Generalized anxiety disorder  F41.1     Plan: Patient is to use CBT and coping skills to decrease anxiety and sadness.  Patient is to start each day with some quiet time to get grounded as she starts the day.  As anxiety increases throughout the day patient is to take time to herself and practice breathing.  Patient is to break down task into manageable pieces so that she does not get overwhelmed. Long-term goal: Resolve the core conflict that is the source of anxiety Short-term goal: Identify the major life complex in the past present the form the basis for present anxiety.  Increase understanding of beliefs and messages that produce the worry and anxiety  [name], [facility]

## 2020-08-10 ENCOUNTER — Encounter: Payer: Self-pay | Admitting: Adult Health

## 2020-08-10 ENCOUNTER — Telehealth (INDEPENDENT_AMBULATORY_CARE_PROVIDER_SITE_OTHER): Payer: 59 | Admitting: Adult Health

## 2020-08-10 DIAGNOSIS — F410 Panic disorder [episodic paroxysmal anxiety] without agoraphobia: Secondary | ICD-10-CM

## 2020-08-10 DIAGNOSIS — G4700 Insomnia, unspecified: Secondary | ICD-10-CM

## 2020-08-10 DIAGNOSIS — F411 Generalized anxiety disorder: Secondary | ICD-10-CM

## 2020-08-10 DIAGNOSIS — F331 Major depressive disorder, recurrent, moderate: Secondary | ICD-10-CM

## 2020-08-10 DIAGNOSIS — F422 Mixed obsessional thoughts and acts: Secondary | ICD-10-CM

## 2020-08-10 MED ORDER — ALPRAZOLAM 1 MG PO TABS: ORAL_TABLET | ORAL | 2 refills | Status: DC

## 2020-08-10 MED ORDER — ESCITALOPRAM OXALATE 10 MG PO TABS: ORAL_TABLET | ORAL | 5 refills | Status: DC

## 2020-08-10 NOTE — Progress Notes (Signed)
Sherry Middleton [id number] Apr 23, 1966 54 y.o.  Virtual Visit via Video Note  I connected with pt @ on 08/10/20 at 11:20 AM EST by a video enabled telemedicine application and verified that I am speaking with the correct person using two identifiers.   I discussed the limitations of evaluation and management by telemedicine and the availability of in person appointments. The patient expressed understanding and agreed to proceed.  I discussed the assessment and treatment plan with the patient. The patient was provided an opportunity to ask questions and all were answered. The patient agreed with the plan and demonstrated an understanding of the instructions.   The patient was advised to call back or seek an in-person evaluation if the symptoms worsen or if the condition fails to improve as anticipated.  I provided 30 minutes of non-face-to-face time during this encounter.  The patient was located at home.  The provider was located at [place].   Aloha Gell, NP   Subjective:   Patient ID:  Sherry Middleton is a 54 y.o. (DOB 07-05-1966) female.  Chief Complaint: No chief complaint on file.   HPI Sherry Middleton presents for follow-up of depression, anxiety, panic attacks, and insomnia.   Describes mood today as "about the same". Pleasant. Tearful at times. Mood symptoms - reports depression, anxiety, and irritability. Reports panic attacks - daily. Had one while on phone with therapist. Stating "they come and go as they please". Xanax does help. Difficulties adjusting to daylight savings. Financial concerns. Initially denies disability. Now working with an Forensic psychologist. Has a stack of paperwork to get through and doesn't know were to start. Stating "I can't deal with stuff any more". Daghter-in-law passed away from Covid in Jun 04, 2023. Son and his family visited over Thanksgiving. Insurance running out at the end of December. Stating "I'm so up I the air about finances".  Also stating "I'm at a loss". Feels like she is doing all that she can. Husband with multiple medical issues. Has increased Lexapro from 10mg  to 15mg  daily. Chronic pain issues. Seeing therapist - [name]. Varying interest and motivation. Taking medications as prescribed Energy levels low. Active, does not have a regular exercise routine with physical disabilities.  Enjoys some usual interests and activities. Married. Lives with husband of 26 years. Spending time with family - 2 sons and their family.    Appetite adequate - IBS flare-ups. Weight gain.  Sleeping difficulties. Averages 6 to 7 hours - "broken sleep". Taking a "while" to get to sleep.  Focus and concentration difficulties at times. Completing tasks. Managing some aspects of household - cleaning.  Denies SI or HI.  Denies AH or VH.  Review of Systems:  Review of Systems  Musculoskeletal: Negative for gait problem.  Neurological: Negative for tremors.  Psychiatric/Behavioral:       Please refer to HPI    Medications: I have reviewed the patient's current medications.  Current Outpatient Medications  Medication Sig Dispense Refill  . ALPRAZolam (XANAX) 1 MG tablet Take 1 tablet three times daily prn anxiety 90 tablet 2  . ascorbic acid (VITAMIN C) 500 MG tablet Take by mouth.    . colestipol (COLESTID) 1 g tablet Take 1 tablet (1 gram) by mouth once to twice a day as needed 60 tablet 3  . dicyclomine (BENTYL) 10 MG capsule Take 1-2 capsules (10-20 mg total) by mouth every 8 (eight) hours as needed for spasms. 90 capsule 1  . DUEXIS 800-26.6 MG TABS Take 1 tablet by mouth every  8 (eight) hours as needed (Pain).     [name] EPINEPHrine 0.3 mg/0.3 mL IJ SOAJ injection SMARTSIG:1 Pre-Filled Pen Syringe IM PRN    . escitalopram (LEXAPRO) 10 MG tablet Take one and 1/2 tablets daily. 45 tablet 5  . furosemide (LASIX) 20 MG tablet Take 20 mg by mouth daily.     [name] ibuprofen (ADVIL) 800 MG tablet Take 800 mg by mouth 3 (three) times daily  as needed.    [name] lisinopril (ZESTRIL) 20 MG tablet Take 20 mg by mouth daily.     [name] lisinopril-hydrochlorothiazide (PRINZIDE,ZESTORETIC) 20-12.5 MG per tablet Take 1 tablet by mouth daily.     [name] omeprazole (PRILOSEC) 40 MG capsule TAKE 1 CAPSULE(40 MG) BY MOUTH TWICE DAILY 180 capsule 2  . ondansetron (ZOFRAN ODT) 4 MG disintegrating tablet Take 1 tablet (4 mg total) by mouth every 6 (six) hours as needed for nausea or vomiting. 90 tablet 3  . polyethylene glycol powder (GLYCOLAX/MIRALAX) 17 GM/SCOOP powder Take 0.5 Containers by mouth daily as needed for mild constipation or moderate constipation. As needed    . Probiotic Product (ALIGN) 4 MG CAPS Take 4 mg by mouth daily.    . Scar GEL Apply 1 application topically daily as needed (Scar). As needed    . spironolactone (ALDACTONE) 25 MG tablet Take 25 mg by mouth daily.     [name] triamcinolone cream (KENALOG) 0.1 % Apply topically 2 (two) times daily.    . Vitamin D, Ergocalciferol, (DRISDOL) 1.25 MG (50000 UT) CAPS capsule Take 50,000 Units by mouth 2 (two) times a week.     . Wheat Dextrin (BENEFIBER DRINK MIX PO) Take 1 Package by mouth daily as needed (constipation).     No current facility-administered medications for this visit.    Medication Side Effects: None  Allergies:  Allergies  Allergen Reactions  . Levaquin [Levofloxacin In D5w] Shortness Of Breath    avalox and cipro   . Other Other (See Comments)    Steroids Hyper sensitive/palpitations  . Asa [Aspirin] Other (See Comments)    Ulcers  . Ibuprofen Other (See Comments)    Ulcers  . Sulfonamide Derivatives Other (See Comments)    Finger nails turn blue    Past Medical History:  Diagnosis Date  . Anal fissure   . Anemia   . Anxiety   . Depression   . GERD (gastroesophageal reflux disease)   . History of weight change   . Hypertension   . Hypertension   . Palpitations   . Peptic ulcer disease   . Scar   . Swelling     Family History  Problem Relation Age of  Onset  . Alcoholism Father   . Esophageal cancer Father   . Heart disease Mother   . Kidney disease Mother   . Breast cancer Sister        1/2 sister  . Diabetes Paternal Grandmother   . Heart disease Paternal Grandmother   . Diabetes Paternal Grandfather   . Crohn's disease Cousin   . Crohn's disease Niece   . Colon cancer Neg Hx   . Pancreatic cancer Neg Hx   . Stomach cancer Neg Hx     Social History   Socioeconomic History  . Marital status: Married    Spouse name: Not on file  . Number of children: Not on file  . Years of education: Not on file  . Highest education level: Not on file  Occupational History  . Not on  file  Tobacco Use  . Smoking status: Never Smoker  . Smokeless tobacco: Never Used  Vaping Use  . Vaping Use: Never used  Substance and Sexual Activity  . Alcohol use: No  . Drug use: No  . Sexual activity: Not on file  Other Topics Concern  . Not on file  Social History Narrative  . Not on file   Social Determinants of Health   Financial Resource Strain:   . Difficulty of Paying Living Expenses: Not on file  Food Insecurity:   . Worried About Charity fundraiser in the Last Year: Not on file  . Ran Out of Food in the Last Year: Not on file  Transportation Needs:   . Lack of Transportation (Medical): Not on file  . Lack of Transportation (Non-Medical): Not on file  Physical Activity:   . Days of Exercise per Week: Not on file  . Minutes of Exercise per Session: Not on file  Stress:   . Feeling of Stress : Not on file  Social Connections:   . Frequency of Communication with Friends and Family: Not on file  . Frequency of Social Gatherings with Friends and Family: Not on file  . Attends Religious Services: Not on file  . Active Member of Clubs or Organizations: Not on file  . Attends Archivist Meetings: Not on file  . Marital Status: Not on file  Intimate Partner Violence:   . Fear of Current or Ex-Partner: Not on file  .  Emotionally Abused: Not on file  . Physically Abused: Not on file  . Sexually Abused: Not on file    Past Medical History, Surgical history, Social history, and Family history were reviewed and updated as appropriate.   Please see review of systems for further details on the patient's review from today.   Objective:   Physical Exam:  There were no vitals taken for this visit.  Physical Exam Constitutional:      General: She is not in acute distress. Musculoskeletal:        General: No deformity.  Neurological:     Mental Status: She is alert and oriented to person, place, and time.     Coordination: Coordination normal.  Psychiatric:        Attention and Perception: Attention and perception normal. She does not perceive auditory or visual hallucinations.        Mood and Affect: Mood normal. Mood is not anxious or depressed. Affect is not labile, blunt, angry or inappropriate.        Speech: Speech normal.        Behavior: Behavior normal.        Thought Content: Thought content normal. Thought content is not paranoid or delusional. Thought content does not include homicidal or suicidal ideation. Thought content does not include homicidal or suicidal plan.        Cognition and Memory: Cognition and memory normal.        Judgment: Judgment normal.     Comments: Insight intact     Lab Review:     Component Value Date/Time   NA 140 10/14/2007 2047   K 4.0 10/14/2007 2047   CL 104 10/14/2007 2047   CO2 25 10/14/2007 2047   GLUCOSE 96 10/14/2007 2047   BUN 9 10/14/2007 2047   CREATININE 0.68 10/14/2007 2047   CALCIUM 8.8 10/14/2007 2047       Component Value Date/Time   WBC 7.3 10/14/2007 2047   RBC 3.98  10/14/2007 2047   HGB 8.3 (L) 10/14/2007 2047   HCT 30.3 (L) 10/14/2007 2047   PLT 380 10/14/2007 2047   MCV 76.1 (L) 10/14/2007 2047   MCHC 27.4 (L) 10/14/2007 2047   RDW 21.9 (H) 10/14/2007 2047   LYMPHSABS 2.0 10/25/2006 1625   MONOABS 0.5 10/25/2006 1625    EOSABS 0.1 10/25/2006 1625   BASOSABS 0.0 10/25/2006 1625    No results found for: POCLITH, LITHIUM   No results found for: PHENYTOIN, PHENOBARB, VALPROATE, CBMZ   .res Assessment: Plan:    Plan:  1. Continue Lexapro 15mg  daily  2. Continue Xanax 1mg  TID  Continue therapy with [name]  Patient disabled and unable to work.  Discussed potential benefits, risk, and side effects of benzodiazepines to include potential risk of tolerance and dependence, as well as possible drowsiness.  Advised patient not to drive if experiencing drowsiness and to take lowest possible effective dose to minimize risk of dependence and tolerance.  RTC 4/6 weeks     Diagnoses and all orders for this visit:  Generalized anxiety disorder -     ALPRAZolam (XANAX) 1 MG tablet; Take 1 tablet three times daily prn anxiety -     escitalopram (LEXAPRO) 10 MG tablet; Take one and 1/2 tablets daily.  Major depressive disorder, recurrent episode, moderate (HCC) -     escitalopram (LEXAPRO) 10 MG tablet; Take one and 1/2 tablets daily.  Panic attacks -     ALPRAZolam (XANAX) 1 MG tablet; Take 1 tablet three times daily prn anxiety -     escitalopram (LEXAPRO) 10 MG tablet; Take one and 1/2 tablets daily.  Insomnia, unspecified type  Mixed obsessional thoughts and acts     Please see After Visit Summary for patient specific instructions.  Future Appointments  Date Time Provider [place]  08/19/2020 11:00 AM [name], [facility] CP-CP None  09/02/2020 11:00 AM [name], [facility] CP-CP None    No orders of the defined types were placed in this encounter.     -------------------------------

## 2020-08-19 ENCOUNTER — Ambulatory Visit (INDEPENDENT_AMBULATORY_CARE_PROVIDER_SITE_OTHER): Payer: 59 | Admitting: Psychiatry

## 2020-08-19 DIAGNOSIS — F411 Generalized anxiety disorder: Secondary | ICD-10-CM
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
## 2020-09-02 ENCOUNTER — Telehealth: Payer: 59 | Admitting: Psychiatry

## 2020-09-14 ENCOUNTER — Ambulatory Visit: Payer: 59 | Admitting: Allergy

## 2020-09-16 ENCOUNTER — Ambulatory Visit (INDEPENDENT_AMBULATORY_CARE_PROVIDER_SITE_OTHER): Payer: 59 | Admitting: Psychiatry

## 2020-09-16 ENCOUNTER — Other Ambulatory Visit: Payer: Self-pay

## 2020-09-16 DIAGNOSIS — F411 Generalized anxiety disorder: Secondary | ICD-10-CM | POA: Diagnosis not present

## 2020-09-16 NOTE — Progress Notes (Signed)
      [facility] Counselor/Therapist Progress Note  Patient ID: Vollie Aaron, MRN: [id number],    Date: 09/16/2020  Time Spent: 52 minutes start time 4:04 p.m. end time 4:56 PM  Treatment Type: Individual Therapy  Reported Symptoms: anxiety, sadness, triggered responses, grief issues, fatigue, sleep issues, nightmares, low motivation, rumination  Mental Status Exam:  Appearance:   Well Groomed     Behavior:  Appropriate  Motor:  Normal  Speech/Language:   Normal Rate  Affect:  Appropriate  Mood:  sad  Thought process:  normal  Thought content:    WNL  Sensory/Perceptual disturbances:    WNL  Orientation:  oriented to person, place, time/date and situation  Attention:  Good  Concentration:  Good  Memory:  WNL  Fund of knowledge:   Good  Insight:    Good  Judgment:   Good  Impulse Control:  Good   Risk Assessment: Danger to Self:  No Self-injurious Behavior: No Danger to Others: No Duty to Warn:no Physical Aggression / Violence:No  Access to Firearms a concern: No  Gang Involvement:No   Subjective: Patient was present for session.  She shared 1 of her twins is doing better but still not back at work and her other twin now tested positive for it.  Her sister, niece, and their family have [place] again as well.  She shared she is just tired.She went on to share that Heather's death has been real hard to process because she has always protected her  Children but she couldn't save Heather.  She is realizing she has always gotten the message she has to handle everything and now with her body she just can't do it.  Patient went on to explain she feels that is why she is having so much anxiety and sadness and she is not sure how to work through that.  Did EMDR set on feeling she has to perform, suds level 10, negative cognition "I have to do it all" felt anxiety and sadness in her stomach and chest.  Patient was able to reduce suds level to 4.  She was able to realize that she has  to delegate some things to other people because she is just not capable of functioning the way she used to physically and emotionally.  Patient was able to develop some plans that help her sort things out so that she can him have her husband and sister-in-law do more things around the house so her to do lists may be a little more realistic.  Interventions: Solution-Oriented/Positive Psychology and Eye Movement Desensitization and Reprocessing (EMDR)  Diagnosis:   ICD-10-CM   1. Generalized anxiety disorder  F41.1     Plan: Patient is to use CBT and coping skills to decrease anxiety symptoms.  Patient is to work on taking time to ground herself each morning to start the day.  Patient is to ask husband and sister-in-law to help with the to do list each day.  Patient is to remind herself that she is not a failure just because she cannot do all that she used to. Long-term goal: Resolve the core conflict that is a source of anxiety Short-term goal: Identify the major life conflicts in the past and present the form the basis for present anxiety.  Increase understanding of beliefs and messages that produce the worry and anxiety  [name], [facility]

## 2020-10-26 ENCOUNTER — Telehealth: Payer: Self-pay | Admitting: Adult Health

## 2020-10-26 NOTE — Telephone Encounter (Signed)
I let pt know that she can take Gaba. Doctor just told her it would be a very small dose.

## 2020-10-26 NOTE — Telephone Encounter (Signed)
Yes. May also be helpful for anxiety. What dose are they starting with?

## 2020-10-26 NOTE — Telephone Encounter (Signed)
Noted  

## 2020-10-26 NOTE — Telephone Encounter (Signed)
Her PCP wants to prescribe gabapentin for [facility]. It would be a low dose of gabapentin to take at night.  It's for systemic pain. Is this okay for her to take?

## 2020-10-27 ENCOUNTER — Ambulatory Visit (INDEPENDENT_AMBULATORY_CARE_PROVIDER_SITE_OTHER): Payer: Self-pay | Admitting: Psychiatry

## 2020-10-27 DIAGNOSIS — F411 Generalized anxiety disorder: Secondary | ICD-10-CM

## 2020-10-27 NOTE — Progress Notes (Signed)
[facility] Counselor/Therapist Progress Note  Patient ID: Sherry Middleton, MRN: [id number],    Date: 10/27/2020  Time Spent: 50 minutes start time 3:05 PM end time 3:55 PM Virtual Visit via Telephone Note Connected with patient by a video enabled telemedicine/telehealth application, with their informed consent, and verified patient privacy and that I am speaking with the correct person using two identifiers. I discussed the limitations, risks, security and privacy concerns of performing psychotherapy and management service by telephone and the availability of in person appointments. I also discussed with the patient that there may be a patient responsible charge related to this service. The patient expressed understanding and agreed to proceed. I discussed the treatment planning with the patient. The patient was provided an opportunity to ask questions and all were answered. The patient agreed with the plan and demonstrated an understanding of the instructions. The patient was advised to call  our office if  symptoms worsen or feel they are in a crisis state and need immediate contact.   Therapist Location: office Patient Location: home    Treatment Type: Individual Therapy  Reported Symptoms: stomach issues, anxiety, sadness, grief issues, panic attacks, low motivation, fatigue, memory issues, break downs, rumination, focusing issues, crying spells. Chronic pain issues, migraines, intrusive thoughts  Mental Status Exam:  Appearance:   Casual     Behavior:  Appropriate  Motor:  Normal  Speech/Language:   Normal Rate  Affect:  Appropriate tearful  Mood:  anxious and sad  Thought process:  normal  Thought content:    WNL  Sensory/Perceptual disturbances:    WNL  Orientation:  oriented to person, place, time/date and situation  Attention:  Good  Concentration:  Good  Memory:  fair  Fund of knowledge:   Good  Insight:    Good  Judgment:   Good  Impulse Control:  Good    Risk Assessment: Danger to Self:  No current thoughts but she has have intrusive thoughts of not wanting to be here Self-injurious Behavior: No Danger to Others: No Duty to Warn:no Physical Aggression / Violence:No  Access to Firearms a concern: No  Gang Involvement:No   Subjective: Met with patient via virtual session.  She shared that things continue to be difficult for  Her family.  She shared that they are all still grieving her daughter in law and now another daughter in law just found out that she has a whole in her heart and they are not sure what they will do for her.She shared she is overwhelmed with the finance issues and is fearful she will loss her home.  She is also having pain issues chronically. She went on to share that she is trying to focus on the right things and remind herself when things work out in ways she can't imagine. She shared the fact that she didn't have stability gowning up was triggered and she realized she lived in 82 different places.  She shared she quit school when she was 13 due to not having friends.  She did get her GED so she could home school her children. Patient reported that she had 20 years when she was working and she had stability and now she has had to go back to not knowing what will happen and she is not handling it well, patient reported. She shared she was so tired of living without stability especially after she was able to find stability and work.  Patient was encouraged to try to stay focused  on the things that are working out and ways that she cannot imagine.  She was encouraged to recognize she was able to raise 3 boys who are all supportive of her and even though each of them have been going through their trials will be there for her.  Patient was encouraged to take time each morning to get grounded because that seems to help her function throughout the day and to focus on the things that she can control fix and change at this time.  Patient was  also encouraged to try and think of the things she is thankful for more than the things that seem to be unable to be fixed.  Patient shared due to finances she may not be able to return to treatment at this time.  She shared that she has found another possible treatment option.  Encouraged her to stay in treatment and to know if she is having any of those intrusive thoughts about not wanting to be here that clinician will still be here for guidance.  Interventions: Cognitive Behavioral Therapy and Solution-Oriented/Positive Psychology  Diagnosis:   ICD-10-CM   1. Generalized anxiety disorder  F41.1     Plan: Patient is to use CBT and coping skills to decrease anxiety symptoms.  Patient is to focus on the things that she can control fix and change.  Patient is to try and remember the things that she is thankful for.  Patient is to take time in the morning to get herself grounded for the day.  Patient is to consider other options for treatment that are financially possible at this time.  Patient will continue taking medication as directed. Long-term goal: Resolve the core conflict that is a source of anxiety Short-term goal: Identify the major life complex from the past and present that form the basis for present anxiety.  Increase understanding of beliefs and messages that produce the worry and anxiety  [name], [facility]

## 2020-11-24 ENCOUNTER — Ambulatory Visit (INDEPENDENT_AMBULATORY_CARE_PROVIDER_SITE_OTHER): Payer: Self-pay | Admitting: Adult Health

## 2020-11-24 ENCOUNTER — Other Ambulatory Visit: Payer: Self-pay

## 2020-11-24 ENCOUNTER — Encounter: Payer: Self-pay | Admitting: Adult Health

## 2020-11-24 DIAGNOSIS — F411 Generalized anxiety disorder: Secondary | ICD-10-CM

## 2020-11-24 DIAGNOSIS — F410 Panic disorder [episodic paroxysmal anxiety] without agoraphobia: Secondary | ICD-10-CM

## 2020-11-24 DIAGNOSIS — F331 Major depressive disorder, recurrent, moderate: Secondary | ICD-10-CM

## 2020-11-24 DIAGNOSIS — G4700 Insomnia, unspecified: Secondary | ICD-10-CM

## 2020-11-24 DIAGNOSIS — F909 Attention-deficit hyperactivity disorder, unspecified type: Secondary | ICD-10-CM

## 2020-11-24 DIAGNOSIS — F422 Mixed obsessional thoughts and acts: Secondary | ICD-10-CM

## 2020-11-24 NOTE — Progress Notes (Signed)
Sherry Middleton [id number] 1965-11-18 55 y.o.  Subjective:   Patient ID:  Sherry Middleton is a 55 y.o. (DOB [birth year]) female.  Chief Complaint: No chief complaint on file.   HPI Sherry Middleton presents to the office today for follow-up of depression, anxiety, panic attacks, and insomnia.   Describes mood today as "about the same". Pleasant. Tearful at times. Mood symptoms - reports depression, anxiety, and irritability. Increased stress - "it's unreal". Reports panic attacks. OCD symptoms worsening during different times of the year. Stating "I got OCD insane a few weeks ago for about 4 day". Felt like it had to be done. Working until she could hardly move. Taking a while to get out of the bed in the morning. Increased Lexapro 10mg  to 15mg  daily. Chronic pain issues - working with PCP - plans to have further testing. Having "good and bad" days. Depression better this month. Reports break through panic attacks 4 to 5 since last visit. Forgetful - having to write things down. Stating "I used to be able to keep up with things". Trying to make herself remember that she needs to cook for her and her husband. Getting overwhelmed easily. Misses daughter in law - passed away from [place]. Worries about sons. Seeing a new PCP. Awaiting disability determination. Financial stressors. Worried how she will pay hers bills every day.  Husband with multiple medical issues.  Plans to see a new therapist. Unable to see [name] due to insurance coverage. Varying interest and motivation. Taking medications as prescribed Energy levels remain low. Active, does not have a regular exercise routine with physical disabilities.  Enjoys some usual interests and activities. Married. Lives with husband and dog. Spending time with family - 2 sons and their family.    Appetite adequate - IBS flare-ups. Weight gain.  Sleeping difficulties. Averages 6 to 7 hours - broken sleep - up and down during the night. Tossing  and turning - waking up with a headache.  Focus and concentration difficulties at times. Completing tasks. Managing some aspects of household - cleaning. Unable to work with current physical and emotional limitations. Denies SI or HI.  Denies AH or VH.  Review of Systems:  Review of Systems  Musculoskeletal: Negative for gait problem.  Neurological: Negative for tremors.  Psychiatric/Behavioral:       Please refer to HPI    Medications: I have reviewed the patient's current medications.  Current Outpatient Medications  Medication Sig Dispense Refill  . ALPRAZolam (XANAX) 1 MG tablet Take 1 tablet three times daily prn anxiety 90 tablet 2  . ascorbic acid (VITAMIN C) 500 MG tablet Take by mouth.    . colestipol (COLESTID) 1 g tablet Take 1 tablet (1 gram) by mouth once to twice a day as needed 60 tablet 3  . dicyclomine (BENTYL) 10 MG capsule Take 1-2 capsules (10-20 mg total) by mouth every 8 (eight) hours as needed for spasms. 90 capsule 1  . DUEXIS 800-26.6 MG TABS Take 1 tablet by mouth every 8 (eight) hours as needed (Pain).     [name] EPINEPHrine 0.3 mg/0.3 mL IJ SOAJ injection SMARTSIG:1 Pre-Filled Pen Syringe IM PRN    . escitalopram (LEXAPRO) 10 MG tablet Take one and 1/2 tablets daily. 45 tablet 5  . furosemide (LASIX) 20 MG tablet Take 20 mg by mouth daily.     [name] ibuprofen (ADVIL) 800 MG tablet Take 800 mg by mouth 3 (three) times daily as needed.    [name] lisinopril (ZESTRIL) 20 MG tablet  Take 20 mg by mouth daily.     [name] lisinopril-hydrochlorothiazide (PRINZIDE,ZESTORETIC) 20-12.5 MG per tablet Take 1 tablet by mouth daily.     [name] omeprazole (PRILOSEC) 40 MG capsule TAKE 1 CAPSULE(40 MG) BY MOUTH TWICE DAILY 180 capsule 2  . ondansetron (ZOFRAN ODT) 4 MG disintegrating tablet Take 1 tablet (4 mg total) by mouth every 6 (six) hours as needed for nausea or vomiting. 90 tablet 3  . polyethylene glycol powder (GLYCOLAX/MIRALAX) 17 GM/SCOOP powder Take 0.5 Containers by mouth daily as  needed for mild constipation or moderate constipation. As needed    . Probiotic Product (ALIGN) 4 MG CAPS Take 4 mg by mouth daily.    . Scar GEL Apply 1 application topically daily as needed (Scar). As needed    . spironolactone (ALDACTONE) 25 MG tablet Take 25 mg by mouth daily.     [name] triamcinolone cream (KENALOG) 0.1 % Apply topically 2 (two) times daily.    . Vitamin D, Ergocalciferol, (DRISDOL) 1.25 MG (50000 UT) CAPS capsule Take 50,000 Units by mouth 2 (two) times a week.     . Wheat Dextrin (BENEFIBER DRINK MIX PO) Take 1 Package by mouth daily as needed (constipation).     No current facility-administered medications for this visit.    Medication Side Effects: None  Allergies:  Allergies  Allergen Reactions  . Levaquin [Levofloxacin In D5w] Shortness Of Breath    avalox and cipro   . Other Other (See Comments)    Steroids Hyper sensitive/palpitations  . Asa [Aspirin] Other (See Comments)    Ulcers  . Ibuprofen Other (See Comments)    Ulcers  . Sulfonamide Derivatives Other (See Comments)    Finger nails turn blue    Past Medical History:  Diagnosis Date  . Anal fissure   . Anemia   . Anxiety   . Depression   . GERD (gastroesophageal reflux disease)   . History of weight change   . Hypertension   . Hypertension   . Palpitations   . Peptic ulcer disease   . Scar   . Swelling     Family History  Problem Relation Age of Onset  . Alcoholism Father   . Esophageal cancer Father   . Heart disease Mother   . Kidney disease Mother   . Breast cancer Sister        1/2 sister  . Diabetes Paternal Grandmother   . Heart disease Paternal Grandmother   . Diabetes Paternal Grandfather   . Crohn's disease Cousin   . Crohn's disease Niece   . Colon cancer Neg Hx   . Pancreatic cancer Neg Hx   . Stomach cancer Neg Hx     Social History   Socioeconomic History  . Marital status: Married    Spouse name: Not on file  . Number of children: Not on file  . Years of  education: Not on file  . Highest education level: Not on file  Occupational History  . Not on file  Tobacco Use  . Smoking status: Never Smoker  . Smokeless tobacco: Never Used  Vaping Use  . Vaping Use: Never used  Substance and Sexual Activity  . Alcohol use: No  . Drug use: No  . Sexual activity: Not on file  Other Topics Concern  . Not on file  Social History Narrative  . Not on file   Social Determinants of Health   Financial Resource Strain: Not on file  Food Insecurity: Not on file  Transportation Needs: Not on file  Physical Activity: Not on file  Stress: Not on file  Social Connections: Not on file  Intimate Partner Violence: Not on file    Past Medical History, Surgical history, Social history, and Family history were reviewed and updated as appropriate.   Please see review of systems for further details on the patient's review from today.   Objective:   Physical Exam:  There were no vitals taken for this visit.  Physical Exam Constitutional:      General: She is not in acute distress. Musculoskeletal:        General: No deformity.  Neurological:     Mental Status: She is alert and oriented to person, place, and time.     Coordination: Coordination normal.  Psychiatric:        Attention and Perception: Attention and perception normal. She does not perceive auditory or visual hallucinations.        Mood and Affect: Mood normal. Mood is not anxious or depressed. Affect is not labile, blunt, angry or inappropriate.        Speech: Speech normal.        Behavior: Behavior normal.        Thought Content: Thought content normal. Thought content is not paranoid or delusional. Thought content does not include homicidal or suicidal ideation. Thought content does not include homicidal or suicidal plan.        Cognition and Memory: Cognition and memory normal.        Judgment: Judgment normal.     Comments: Insight intact     Lab Review:     Component Value  Date/Time   NA 140 10/14/2007 2047   K 4.0 10/14/2007 2047   CL 104 10/14/2007 2047   CO2 25 10/14/2007 2047   GLUCOSE 96 10/14/2007 2047   BUN 9 10/14/2007 2047   CREATININE 0.68 10/14/2007 2047   CALCIUM 8.8 10/14/2007 2047       Component Value Date/Time   WBC 7.3 10/14/2007 2047   RBC 3.98 10/14/2007 2047   HGB 8.3 (L) 10/14/2007 2047   HCT 30.3 (L) 10/14/2007 2047   PLT 380 10/14/2007 2047   MCV 76.1 (L) 10/14/2007 2047   MCHC 27.4 (L) 10/14/2007 2047   RDW 21.9 (H) 10/14/2007 2047   LYMPHSABS 2.0 10/25/2006 1625   MONOABS 0.5 10/25/2006 1625   EOSABS 0.1 10/25/2006 1625   BASOSABS 0.0 10/25/2006 1625    No results found for: POCLITH, LITHIUM   No results found for: PHENYTOIN, PHENOBARB, VALPROATE, CBMZ   .res Assessment: Plan:     Plan:  1. Continue Lexapro 15mg  daily  2. Continue Xanax 1mg  TID  Consider Cymbalta 30mg  daily  Continue therapy with [name]  Patient disabled and unable to work.  Discussed potential benefits, risk, and side effects of benzodiazepines to include potential risk of tolerance and dependence, as well as possible drowsiness.  Advised patient not to drive if experiencing drowsiness and to take lowest possible effective dose to minimize risk of dependence and tolerance.  RTC 4/6 weeks   Diagnoses and all orders for this visit:  Generalized anxiety disorder  Major depressive disorder, recurrent episode, moderate (HCC)  Panic attacks  Insomnia, unspecified type  Mixed obsessional thoughts and acts  Attention deficit hyperactivity disorder (ADHD), unspecified ADHD type     Please see After Visit Summary for patient specific instructions.  Future Appointments  Date Time Provider [place]  12/27/2020  1:20 PM Aubreana Cornacchia, Berdie Ogren,  NP CP-CP None    No orders of the defined types were placed in this encounter.   -------------------------------

## 2020-12-27 ENCOUNTER — Other Ambulatory Visit: Payer: Self-pay

## 2020-12-27 ENCOUNTER — Ambulatory Visit (INDEPENDENT_AMBULATORY_CARE_PROVIDER_SITE_OTHER): Payer: Self-pay | Admitting: Adult Health

## 2020-12-27 ENCOUNTER — Encounter: Payer: Self-pay | Admitting: Adult Health

## 2020-12-27 DIAGNOSIS — F331 Major depressive disorder, recurrent, moderate: Secondary | ICD-10-CM

## 2020-12-27 DIAGNOSIS — F410 Panic disorder [episodic paroxysmal anxiety] without agoraphobia: Secondary | ICD-10-CM

## 2020-12-27 DIAGNOSIS — F411 Generalized anxiety disorder: Secondary | ICD-10-CM

## 2020-12-27 MED ORDER — ALPRAZOLAM 1 MG PO TABS
ORAL_TABLET | ORAL | 2 refills | Status: DC
Start: 2020-12-27 — End: 2021-02-21

## 2020-12-27 MED ORDER — ESCITALOPRAM OXALATE 10 MG PO TABS: ORAL_TABLET | ORAL | 5 refills | Status: DC

## 2020-12-27 NOTE — Progress Notes (Signed)
[name] [id number] [birth year] 55 y.o.  Subjective:   Patient ID:  Sherry Middleton is a 55 y.o. (DOB [birth year]) female.  Chief Complaint: No chief complaint on file.   HPI [name] presents to the office today for follow-up of depression, anxiety, panic attacks.   Describes mood today as "about the same". Pleasant. Tearful at times. Mood symptoms - and reports depression, anxiety, and irritability. Increased stressors. Reports panic attacks. Obsessional thoughts and acts. Having days lacking self care and hygiene. Not feeling any good for anyone because she can't work anymore. Stating "I have a lot of shame". Has lost herself and is trying to find herself again. Having panic attacks - 3 to 4 times a week. Finacical stressors. Daughter recently found out she has a "hole" in her heart. Seeing a new therapist  - [facility]. Bringing up a lot of things. Self loathing and hatred. Journaling more. Forgetful - has to write things down. Chronic pain issues and IBS - working with PCP. Varying interest and motivation. Taking medications as prescribed Awaiting disability determination. Energy levels low. Active, does not have a regular exercise routine with physical disabilities.  Enjoys some usual interests and activities. Married. Lives with husband and dog. Spending time with family - 2 sons. Appetite adequate - IBS flare-ups. Weight gain.  Sleeping difficulties. Averages 6 to 7 hours - broken sleep.  Focus and concentration difficulties at times. Forgetful. Completing tasks. Managing some aspects of household - cleaning. Unable to work with current physical and emotional limitations. Denies SI or HI.  Denies AH or VH.  Review of Systems:  Review of Systems  Musculoskeletal: Negative for gait problem.  Neurological: Negative for tremors.  Psychiatric/Behavioral:       Please refer to HPI    Medications: I have reviewed the patient's current medications.  Current  Outpatient Medications  Medication Sig Dispense Refill  . ALPRAZolam (XANAX) 1 MG tablet Take 1 tablet three times daily prn anxiety 90 tablet 2  . ascorbic acid (VITAMIN C) 500 MG tablet Take by mouth.    . colestipol (COLESTID) 1 g tablet Take 1 tablet (1 gram) by mouth once to twice a day as needed 60 tablet 3  . dicyclomine (BENTYL) 10 MG capsule Take 1-2 capsules (10-20 mg total) by mouth every 8 (eight) hours as needed for spasms. 90 capsule 1  . DUEXIS 800-26.6 MG TABS Take 1 tablet by mouth every 8 (eight) hours as needed (Pain).     [name] EPINEPHrine 0.3 mg/0.3 mL IJ SOAJ injection SMARTSIG:1 Pre-Filled Pen Syringe IM PRN    . escitalopram (LEXAPRO) 10 MG tablet Take one and 1/2 tablets daily. 45 tablet 5  . furosemide (LASIX) 20 MG tablet Take 20 mg by mouth daily.     [name] ibuprofen (ADVIL) 800 MG tablet Take 800 mg by mouth 3 (three) times daily as needed.    [name] lisinopril (ZESTRIL) 20 MG tablet Take 20 mg by mouth daily.     [name] lisinopril-hydrochlorothiazide (PRINZIDE,ZESTORETIC) 20-12.5 MG per tablet Take 1 tablet by mouth daily.     [name] omeprazole (PRILOSEC) 40 MG capsule TAKE 1 CAPSULE(40 MG) BY MOUTH TWICE DAILY 180 capsule 2  . ondansetron (ZOFRAN ODT) 4 MG disintegrating tablet Take 1 tablet (4 mg total) by mouth every 6 (six) hours as needed for nausea or vomiting. 90 tablet 3  . polyethylene glycol powder (GLYCOLAX/MIRALAX) 17 GM/SCOOP powder Take 0.5 Containers by mouth daily as needed for mild constipation or moderate constipation. As needed    .  Probiotic Product (ALIGN) 4 MG CAPS Take 4 mg by mouth daily.    . Scar GEL Apply 1 application topically daily as needed (Scar). As needed    . spironolactone (ALDACTONE) 25 MG tablet Take 25 mg by mouth daily.     [name] triamcinolone cream (KENALOG) 0.1 % Apply topically 2 (two) times daily.    . Vitamin D, Ergocalciferol, (DRISDOL) 1.25 MG (50000 UT) CAPS capsule Take 50,000 Units by mouth 2 (two) times a week.     . Wheat Dextrin (BENEFIBER  DRINK MIX PO) Take 1 Package by mouth daily as needed (constipation).     No current facility-administered medications for this visit.    Medication Side Effects: None  Allergies:  Allergies  Allergen Reactions  . Levaquin [Levofloxacin In D5w] Shortness Of Breath    avalox and cipro   . Other Other (See Comments)    Steroids Hyper sensitive/palpitations  . Asa [Aspirin] Other (See Comments)    Ulcers  . Ibuprofen Other (See Comments)    Ulcers  . Sulfonamide Derivatives Other (See Comments)    Finger nails turn blue    Past Medical History:  Diagnosis Date  . Anal fissure   . Anemia   . Anxiety   . Depression   . GERD (gastroesophageal reflux disease)   . History of weight change   . Hypertension   . Hypertension   . Palpitations   . Peptic ulcer disease   . Scar   . Swelling     Past Medical History, Surgical history, Social history, and Family history were reviewed and updated as appropriate.   Please see review of systems for further details on the patient's review from today.   Objective:   Physical Exam:  There were no vitals taken for this visit.  Physical Exam Constitutional:      General: She is not in acute distress. Musculoskeletal:        General: No deformity.  Neurological:     Mental Status: She is alert and oriented to person, place, and time.     Coordination: Coordination normal.  Psychiatric:        Attention and Perception: Attention and perception normal. She does not perceive auditory or visual hallucinations.        Mood and Affect: Mood normal. Mood is not anxious or depressed. Affect is not labile, blunt, angry or inappropriate.        Speech: Speech normal.        Behavior: Behavior normal.        Thought Content: Thought content normal. Thought content is not paranoid or delusional. Thought content does not include homicidal or suicidal ideation. Thought content does not include homicidal or suicidal plan.        Cognition and  Memory: Cognition and memory normal.        Judgment: Judgment normal.     Comments: Insight intact     Lab Review:     Component Value Date/Time   NA 140 10/14/2007 2047   K 4.0 10/14/2007 2047   CL 104 10/14/2007 2047   CO2 25 10/14/2007 2047   GLUCOSE 96 10/14/2007 2047   BUN 9 10/14/2007 2047   CREATININE 0.68 10/14/2007 2047   CALCIUM 8.8 10/14/2007 2047       Component Value Date/Time   WBC 7.3 10/14/2007 2047   RBC 3.98 10/14/2007 2047   HGB 8.3 (L) 10/14/2007 2047   HCT 30.3 (L) 10/14/2007 2047   PLT 380  10/14/2007 2047   MCV 76.1 (L) 10/14/2007 2047   MCHC 27.4 (L) 10/14/2007 2047   RDW 21.9 (H) 10/14/2007 2047   LYMPHSABS 2.0 10/25/2006 1625   MONOABS 0.5 10/25/2006 1625   EOSABS 0.1 10/25/2006 1625   BASOSABS 0.0 10/25/2006 1625    No results found for: POCLITH, LITHIUM   No results found for: PHENYTOIN, PHENOBARB, VALPROATE, CBMZ   .res Assessment: Plan:    Plan:  1. Continue Lexapro 15mg  daily  2. Continue Xanax 1mg  TID  Consider Cymbalta 30mg  daily  Continue therapy Dena Billet  Patient disabled and unable to work.  Discussed potential benefits, risk, and side effects of benzodiazepines to include potential risk of tolerance and dependence, as well as possible drowsiness.  Advised patient not to drive if experiencing drowsiness and to take lowest possible effective dose to minimize risk of dependence and tolerance.  RTC 4/6 weeks    Diagnoses and all orders for this visit:  Generalized anxiety disorder -     ALPRAZolam (XANAX) 1 MG tablet; Take 1 tablet three times daily prn anxiety -     escitalopram (LEXAPRO) 10 MG tablet; Take one and 1/2 tablets daily.  Panic attacks -     ALPRAZolam (XANAX) 1 MG tablet; Take 1 tablet three times daily prn anxiety -     escitalopram (LEXAPRO) 10 MG tablet; Take one and 1/2 tablets daily.  Major depressive disorder, recurrent episode, moderate (HCC) -     escitalopram (LEXAPRO) 10 MG tablet; Take  one and 1/2 tablets daily.     Please see After Visit Summary for patient specific instructions.  No future appointments.  No orders of the defined types were placed in this encounter.   -------------------------------

## 2021-02-11 ENCOUNTER — Ambulatory Visit: Payer: Self-pay | Admitting: Sports Medicine

## 2021-02-16 ENCOUNTER — Ambulatory Visit (INDEPENDENT_AMBULATORY_CARE_PROVIDER_SITE_OTHER): Payer: Medicaid Other

## 2021-02-16 ENCOUNTER — Ambulatory Visit: Payer: Medicaid Other | Admitting: Sports Medicine

## 2021-02-16 ENCOUNTER — Encounter: Payer: Self-pay | Admitting: Sports Medicine

## 2021-02-16 ENCOUNTER — Other Ambulatory Visit: Payer: Self-pay

## 2021-02-16 DIAGNOSIS — M069 Rheumatoid arthritis, unspecified: Secondary | ICD-10-CM

## 2021-02-16 DIAGNOSIS — M79672 Pain in left foot: Secondary | ICD-10-CM

## 2021-02-16 DIAGNOSIS — M722 Plantar fascial fibromatosis: Secondary | ICD-10-CM

## 2021-02-16 DIAGNOSIS — M779 Enthesopathy, unspecified: Secondary | ICD-10-CM

## 2021-02-16 DIAGNOSIS — M79671 Pain in right foot: Secondary | ICD-10-CM | POA: Diagnosis not present

## 2021-02-16 DIAGNOSIS — M19079 Primary osteoarthritis, unspecified ankle and foot: Secondary | ICD-10-CM

## 2021-02-16 DIAGNOSIS — R2681 Unsteadiness on feet: Secondary | ICD-10-CM

## 2021-02-16 MED ORDER — TRIAMCINOLONE ACETONIDE 10 MG/ML IJ SUSP
10.0000 mg | Freq: Once | INTRAMUSCULAR | Status: AC
  Administered 2021-02-16: 10 mg

## 2021-02-16 NOTE — Progress Notes (Signed)
Subjective: Sherry Middleton is a 55 y.o. female patient who presents to office for evaluation of right and left foot pain. Patient notes that her feet constantly hurt and has consistent swelling pain is 10 out of 10 in the morning with significant stiffness upon weightbearing however throughout the day 7 out of 10 to take ibuprofen and Tylenol and reports that her PCP did give her a Kenalog shot in her hip that she did well with that helped some.  Patient is frustrated with having to live with a lot of pain and states that her PCP is working her up for fibromyalgia.  Patient denies any other pedal complaints at this time.   Patient Active Problem List   Diagnosis Date Noted   Abdominal pain, epigastric    Dysphagia    Gastroesophageal reflux disease    Bloating    Change in bowel habit    Murmur, cardiac 04/13/2016   Morbid (severe) obesity due to excess calories ([place]) 04/13/2016   Benign essential hypertension 04/13/2016   Asthma 09/01/2015   S/P repair of ventral hernia 09/01/2015   Fibroids 03/30/2015   Impingement syndrome of right shoulder 12/29/2014   Generalized anxiety disorder 02/08/2013   Major depressive disorder, recurrent episode ([employer]) 02/08/2013   Housing or economic circumstance 06/18/2012   VIRAL GASTROENTERITIS 10/25/2006   VIRAL URI 10/25/2006   ANEMIA-IRON DEFICIENCY 09/05/2006   OBESITY 06/07/2006   HYPERTENSION 06/07/2006   METRORRHAGIA 06/07/2006   LATERAL EPICONDYLITIS 06/07/2006    Current Outpatient Medications on File Prior to Visit  Medication Sig Dispense Refill   ALPRAZolam (XANAX) 1 MG tablet Take 1 tablet three times daily prn anxiety 90 tablet 2   ascorbic acid (VITAMIN C) 500 MG tablet Take by mouth.     colestipol (COLESTID) 1 g tablet Take 1 tablet (1 gram) by mouth once to twice a day as needed 60 tablet 3   dicyclomine (BENTYL) 10 MG capsule Take 1-2 capsules (10-20 mg total) by mouth every 8 (eight) hours as needed for spasms. 90 capsule 1    DUEXIS 800-26.6 MG TABS Take 1 tablet by mouth every 8 (eight) hours as needed (Pain).      EPINEPHrine 0.3 mg/0.3 mL IJ SOAJ injection SMARTSIG:1 Pre-Filled Pen Syringe IM PRN     escitalopram (LEXAPRO) 10 MG tablet Take one and 1/2 tablets daily. 45 tablet 5   furosemide (LASIX) 20 MG tablet Take 20 mg by mouth daily.      ibuprofen (ADVIL) 800 MG tablet Take 800 mg by mouth 3 (three) times daily as needed.     lisinopril (ZESTRIL) 20 MG tablet Take 20 mg by mouth daily.      lisinopril-hydrochlorothiazide (PRINZIDE,ZESTORETIC) 20-12.5 MG per tablet Take 1 tablet by mouth daily.      omeprazole (PRILOSEC) 40 MG capsule TAKE 1 CAPSULE(40 MG) BY MOUTH TWICE DAILY 180 capsule 2   ondansetron (ZOFRAN ODT) 4 MG disintegrating tablet Take 1 tablet (4 mg total) by mouth every 6 (six) hours as needed for nausea or vomiting. 90 tablet 3   polyethylene glycol powder (GLYCOLAX/MIRALAX) 17 GM/SCOOP powder Take 0.5 Containers by mouth daily as needed for mild constipation or moderate constipation. As needed     Probiotic Product (ALIGN) 4 MG CAPS Take 4 mg by mouth daily.     Scar GEL Apply 1 application topically daily as needed (Scar). As needed     spironolactone (ALDACTONE) 25 MG tablet Take 25 mg by mouth daily.  triamcinolone cream (KENALOG) 0.1 % Apply topically 2 (two) times daily.     valsartan-hydrochlorothiazide (DIOVAN-HCT) 160-12.5 MG tablet Take 1 tablet by mouth daily.     Vitamin D, Ergocalciferol, (DRISDOL) 1.25 MG (50000 UT) CAPS capsule Take 50,000 Units by mouth 2 (two) times a week.      Wheat Dextrin (BENEFIBER DRINK MIX PO) Take 1 Package by mouth daily as needed (constipation).     No current facility-administered medications on file prior to visit.    Allergies  Allergen Reactions   Levaquin [Levofloxacin In D5w] Shortness Of Breath    avalox and cipro    Other Other (See Comments)    Steroids Hyper sensitive/palpitations   Asa [Aspirin] Other (See Comments)     Ulcers   Ibuprofen Other (See Comments)    Ulcers   Sulfonamide Derivatives Other (See Comments)    Finger nails turn blue    Objective:  General: Alert and oriented x3 in no acute distress  Dermatology: No open lesions bilateral lower extremities, no webspace macerations, no ecchymosis bilateral, all nails x 10 are well manicured.  Old surgical scars well-healed bilateral.  Vascular: Dorsalis Pedis and Posterior Tibial pedal pulses difficult to palpate due to body habitus, Capillary Fill Time 3 seconds,(+) pedal hair growth bilateral, swelling to the dorsums of both feet with chronic fat versus lymphedema bilateral lower extremities, Temperature gradient within normal limits.  Neurology: Johney Maine sensation intact via light touch bilateral.  Musculoskeletal: Increased tenderness palpation to fascial course of dorsal midfoot, and dorsal and plantar metatarsal phalangeal joints bilateral with mild increased pain right over left there is varus deformity with previous history of clubfoot as a child.  There is limited range of motion bilateral ankle and midfoot with lesser digital contractures and hallux rigidus noted bilateral.  Gait: Antalgic gait  Xrays  Right and left foot   Impression: Diffuse arthritis to both feet, there is significant calcifications noted in the Achilles tendon of the right, there is significant digital and forefoot deformity noted bilateral, there is significant soft tissue swelling versus increased density due to body habitus change from prior  Assessment and Plan: Problem List Items Addressed This Visit   None Visit Diagnoses     Rheumatoid arthritis involving both feet, unspecified whether rheumatoid factor present ([place])    -  Primary   Relevant Orders   DG Foot Complete Right   DG Foot Complete Left   Foot pain, bilateral       Relevant Orders   DG Foot Complete Right   DG Foot Complete Left   Tendinitis       Relevant Orders   DG Foot Complete Right   DG  Foot Complete Left   Plantar fasciitis, right       Plantar fasciitis, left       Arthritis of foot       Relevant Orders   For home use only DME Other see comment   Ambulatory referral to Rheumatology   Gait instability            -Complete examination performed -Xrays reviewed -Discussed treatement options for chronic foot pain w with arthritis and possible superimposed [place] fasciitis bilateral -After oral consent and aseptic prep, injected a mixture containing 1 ml of 2%  plain lidocaine, 1 ml 0.5% plain marcaine, 0.5 ml of kenalog 10 and 0.5 ml of dexamethasone phosphate into right heel without complication.  However patient did become faint injection so we did not proceed with injecting the  left heel as well.  Patient was monitor blood pressure noted to be normal after resting in Trendelenburg Patient symptoms resolved.  Post-injection care discussed with patient.  -Recommended rest ice elevation and use of compression for swelling -Reviewed previous arthritic panel from last year which only reveals mildly elevated CRP however since patient symptoms are chronic and her PCP is working her up for fibromyalgia I do think at this time it may be worth getting a second opinion from a rheumatologist perspective to see if there is any other recommendations for this patient's care -Recommend patient to get over-the-counter topical Voltaren to try to areas of pain -Continue with good supportive shoes daily for foot type -Prescribed rolling walker to help with allowing patient to rest and decrease overuse or pain since her pain in her feet can cause instability with her gait -Patient to return to office if symptoms worsen or sooner if condition worsens.  Landis Martins, DPM

## 2021-02-16 NOTE — Patient Instructions (Signed)
Topical voltaren gel OTC for foot pain and arthritis

## 2021-02-21 ENCOUNTER — Encounter: Payer: Self-pay | Admitting: Adult Health

## 2021-02-21 ENCOUNTER — Other Ambulatory Visit: Payer: Self-pay

## 2021-02-21 ENCOUNTER — Ambulatory Visit (INDEPENDENT_AMBULATORY_CARE_PROVIDER_SITE_OTHER): Payer: Medicaid Other | Admitting: Adult Health

## 2021-02-21 DIAGNOSIS — F331 Major depressive disorder, recurrent, moderate: Secondary | ICD-10-CM | POA: Diagnosis not present

## 2021-02-21 DIAGNOSIS — F410 Panic disorder [episodic paroxysmal anxiety] without agoraphobia: Secondary | ICD-10-CM | POA: Diagnosis not present

## 2021-02-21 DIAGNOSIS — F422 Mixed obsessional thoughts and acts: Secondary | ICD-10-CM

## 2021-02-21 DIAGNOSIS — F411 Generalized anxiety disorder: Secondary | ICD-10-CM | POA: Diagnosis not present

## 2021-02-21 MED ORDER — ALPRAZOLAM 1 MG PO TABS: ORAL_TABLET | ORAL | 2 refills | Status: DC

## 2021-02-21 NOTE — Progress Notes (Signed)
Sherry Middleton [id number] 03/16/1966 55 y.o.  Subjective:   Patient ID:  Sherry Middleton is a 55 y.o. (DOB 1966-05-12) female.  Chief Complaint: No chief complaint on file.   HPI Sherry Middleton presents to the office today for follow-up of depression, anxiety, panic attacks.   Describes mood today as "better some days than others". Pleasant. Tearful at times. Mood symptoms - reports depression, anxiety, and irritability. Reports panic attacks - "heart beating fast - can't catch my breath". Has days where she struggles to push through. Increased stressors - "falling behind on bills - paying them when I can". Not "wringing" her hands as much over those things. The depression seems to have a "stronger hold" on her right now. Doesn't feel equipped to handle problems at hands. Frustrated with weight gain. Having pain in feet - recent steroid injection. Was prescribed Lyrica for pain - plans to pick up prescription today. Reports decreased obsessional thoughts and acts. Continues to struggle financially. Trying to make it day to day. Working on self care. Has days where she stays in her night gown all day. Waking up with migraines for past two weeks. Seeing a new therapist  - [facility]. Trying to work on not "bullying" herself. Chronic pain issues and IBS - working with PCP. Varying interest and motivation. Taking medications as prescribe. Awaiting disability determination. Energy levels low. Active, does not have a regular exercise routine with physical disabilities.  Enjoys some usual interests and activities. Married. Lives with husband and 2 dogs. Spending time with family - 2 sons. Appetite adequate - IBS flare-ups. Weight gain.  Sleeping difficulties. Averages 6 to 7 hours - broken sleep.  Focus and concentration difficulties at times. Feels like memory is not good. Forgetful - misplacing things. Completing tasks. Managing some aspects of household - cleaning. Unable to work with  current physical and emotional limitations. Denies SI or HI.  Denies AH or VH.    Review of Systems:  Review of Systems  Musculoskeletal:  Negative for gait problem.  Neurological:  Negative for tremors.  Psychiatric/Behavioral:         Please refer to HPI   Medications: I have reviewed the patient's current medications.  Current Outpatient Medications  Medication Sig Dispense Refill   ALPRAZolam (XANAX) 1 MG tablet Take 1 tablet three times daily prn anxiety 90 tablet 2   ascorbic acid (VITAMIN C) 500 MG tablet Take by mouth.     colestipol (COLESTID) 1 g tablet Take 1 tablet (1 gram) by mouth once to twice a day as needed 60 tablet 3   dicyclomine (BENTYL) 10 MG capsule Take 1-2 capsules (10-20 mg total) by mouth every 8 (eight) hours as needed for spasms. 90 capsule 1   DUEXIS 800-26.6 MG TABS Take 1 tablet by mouth every 8 (eight) hours as needed (Pain).      EPINEPHrine 0.3 mg/0.3 mL IJ SOAJ injection SMARTSIG:1 Pre-Filled Pen Syringe IM PRN     escitalopram (LEXAPRO) 10 MG tablet Take one and 1/2 tablets daily. 45 tablet 5   furosemide (LASIX) 20 MG tablet Take 20 mg by mouth daily.      ibuprofen (ADVIL) 800 MG tablet Take 800 mg by mouth 3 (three) times daily as needed.     lisinopril (ZESTRIL) 20 MG tablet Take 20 mg by mouth daily.      lisinopril-hydrochlorothiazide (PRINZIDE,ZESTORETIC) 20-12.5 MG per tablet Take 1 tablet by mouth daily.      omeprazole (PRILOSEC) 40 MG capsule TAKE 1 CAPSULE(40  MG) BY MOUTH TWICE DAILY 180 capsule 2   ondansetron (ZOFRAN ODT) 4 MG disintegrating tablet Take 1 tablet (4 mg total) by mouth every 6 (six) hours as needed for nausea or vomiting. 90 tablet 3   polyethylene glycol powder (GLYCOLAX/MIRALAX) 17 GM/SCOOP powder Take 0.5 Containers by mouth daily as needed for mild constipation or moderate constipation. As needed     Probiotic Product (ALIGN) 4 MG CAPS Take 4 mg by mouth daily.     Scar GEL Apply 1 application topically daily as  needed (Scar). As needed     spironolactone (ALDACTONE) 25 MG tablet Take 25 mg by mouth daily.      triamcinolone cream (KENALOG) 0.1 % Apply topically 2 (two) times daily.     valsartan-hydrochlorothiazide (DIOVAN-HCT) 160-12.5 MG tablet Take 1 tablet by mouth daily.     Vitamin D, Ergocalciferol, (DRISDOL) 1.25 MG (50000 UT) CAPS capsule Take 50,000 Units by mouth 2 (two) times a week.      Wheat Dextrin (BENEFIBER DRINK MIX PO) Take 1 Package by mouth daily as needed (constipation).     No current facility-administered medications for this visit.    Medication Side Effects: None  Allergies:  Allergies  Allergen Reactions   Levaquin [Levofloxacin In D5w] Shortness Of Breath    avalox and cipro    Other Other (See Comments)    Steroids Hyper sensitive/palpitations   Asa [Aspirin] Other (See Comments)    Ulcers   Ibuprofen Other (See Comments)    Ulcers   Sulfonamide Derivatives Other (See Comments)    Finger nails turn blue    Past Medical History:  Diagnosis Date   Anal fissure    Anemia    Anxiety    Depression    GERD (gastroesophageal reflux disease)    History of weight change    Hypertension    Hypertension    Palpitations    Peptic ulcer disease    Scar    Swelling     Past Medical History, Surgical history, Social history, and Family history were reviewed and updated as appropriate.   Please see review of systems for further details on the patient's review from today.   Objective:   Physical Exam:  There were no vitals taken for this visit.  Physical Exam Constitutional:      General: She is not in acute distress. Musculoskeletal:        General: No deformity.  Neurological:     Mental Status: She is alert and oriented to person, place, and time.     Coordination: Coordination normal.  Psychiatric:        Attention and Perception: Attention and perception normal. She does not perceive auditory or visual hallucinations.        Mood and Affect:  Mood normal. Mood is not anxious or depressed. Affect is not labile, blunt, angry or inappropriate.        Speech: Speech normal.        Behavior: Behavior normal.        Thought Content: Thought content normal. Thought content is not paranoid or delusional. Thought content does not include homicidal or suicidal ideation. Thought content does not include homicidal or suicidal plan.        Cognition and Memory: Cognition and memory normal.        Judgment: Judgment normal.     Comments: Insight intact    Lab Review:     Component Value Date/Time   NA 140 10/14/2007 2047  K 4.0 10/14/2007 2047   CL 104 10/14/2007 2047   CO2 25 10/14/2007 2047   GLUCOSE 96 10/14/2007 2047   BUN 9 10/14/2007 2047   CREATININE 0.68 10/14/2007 2047   CALCIUM 8.8 10/14/2007 2047       Component Value Date/Time   WBC 7.3 10/14/2007 2047   RBC 3.98 10/14/2007 2047   HGB 8.3 (L) 10/14/2007 2047   HCT 30.3 (L) 10/14/2007 2047   PLT 380 10/14/2007 2047   MCV 76.1 (L) 10/14/2007 2047   MCHC 27.4 (L) 10/14/2007 2047   RDW 21.9 (H) 10/14/2007 2047   LYMPHSABS 2.0 10/25/2006 1625   MONOABS 0.5 10/25/2006 1625   EOSABS 0.1 10/25/2006 1625   BASOSABS 0.0 10/25/2006 1625    No results found for: POCLITH, LITHIUM   No results found for: PHENYTOIN, PHENOBARB, VALPROATE, CBMZ   .res Assessment: Plan:    Plan:  1. Continue Lexapro 15mg  daily  2. Continue Xanax 1mg  TID  Consider Cymbalta 30mg  daily  Continue therapy Dena Billet  Patient disabled and unable to work at this time with mental health and physical issues..  Discussed potential benefits, risk, and side effects of benzodiazepines to include potential risk of tolerance and dependence, as well as possible drowsiness.  Advised patient not to drive if experiencing drowsiness and to take lowest possible effective dose to minimize risk of dependence and tolerance.  RTC 4/6 weeks  Diagnoses and all orders for this visit:  Panic attacks -      ALPRAZolam (XANAX) 1 MG tablet; Take 1 tablet three times daily prn anxiety  Generalized anxiety disorder -     ALPRAZolam (XANAX) 1 MG tablet; Take 1 tablet three times daily prn anxiety  Major depressive disorder, recurrent episode, moderate ([facility])  Mixed obsessional thoughts and acts    Please see After Visit Summary for patient specific instructions.  Future Appointments  Date Time Provider [place]  04/08/2021  9:45 AM Bo Merino, MD [facility] None  04/22/2021  2:40 PM Maitlyn Penza, Berdie Ogren, NP [facility] None    No orders of the defined types were placed in this encounter.   -------------------------------

## 2021-03-25 NOTE — Progress Notes (Deleted)
Office Visit Note  Patient: Sherry Middleton             Date of Birth: [birth year]           MRN: [id number]             PCP: Earlyne Iba, NP Referring: Landis Martins, DPM Visit Date: 04/08/2021 Occupation: '@GUAROCC'$ @  Subjective:  No chief complaint on file.   History of Present Illness: Sherry Middleton is a 55 y.o. female ***   Activities of Daily Living:  Patient reports morning stiffness for *** {minute/hour:19697}.   Patient {ACTIONS;DENIES/REPORTS:21021675::"Denies"} nocturnal pain.  Difficulty dressing/grooming: {ACTIONS;DENIES/REPORTS:21021675::"Denies"} Difficulty climbing stairs: {ACTIONS;DENIES/REPORTS:21021675::"Denies"} Difficulty getting out of chair: {ACTIONS;DENIES/REPORTS:21021675::"Denies"} Difficulty using hands for taps, buttons, cutlery, and/or writing: {ACTIONS;DENIES/REPORTS:21021675::"Denies"}  No Rheumatology ROS completed.   PMFS History:  Patient Active Problem List   Diagnosis Date Noted   Abdominal pain, epigastric    Dysphagia    Gastroesophageal reflux disease    Bloating    Change in bowel habit    Murmur, cardiac 04/13/2016   Morbid (severe) obesity due to excess calories ([place]) 04/13/2016   Benign essential hypertension 04/13/2016   Asthma 09/01/2015   S/P repair of ventral hernia 09/01/2015   Fibroids 03/30/2015   Impingement syndrome of right shoulder 12/29/2014   Generalized anxiety disorder 02/08/2013   Major depressive disorder, recurrent episode ([place]) 02/08/2013   Housing or economic circumstance 06/18/2012   VIRAL GASTROENTERITIS 10/25/2006   VIRAL URI 10/25/2006   ANEMIA-IRON DEFICIENCY 09/05/2006   OBESITY 06/07/2006   HYPERTENSION 06/07/2006   METRORRHAGIA 06/07/2006   LATERAL EPICONDYLITIS 06/07/2006    Past Medical History:  Diagnosis Date   Anal fissure    Anemia    Anxiety    Depression    GERD (gastroesophageal reflux disease)    History of weight change    Hypertension    Hypertension     Palpitations    Peptic ulcer disease    Scar    Swelling     Family History  Problem Relation Age of Onset   Alcoholism Father    Esophageal cancer Father    Heart disease Mother    Kidney disease Mother    Breast cancer Sister        1/2 sister   Diabetes Paternal Grandmother    Heart disease Paternal Grandmother    Diabetes Paternal Grandfather    Crohn's disease Cousin    Crohn's disease Niece    Colon cancer Neg Hx    Pancreatic cancer Neg Hx    Stomach cancer Neg Hx    Past Surgical History:  Procedure Laterality Date   BIOPSY  06/10/2019   Procedure: BIOPSY;  Surgeon: Yetta Flock, MD;  Location: [facility];  Service: Gastroenterology;;   CHOLECYSTECTOMY     club feet surgery     as a child   COLONOSCOPY WITH PROPOFOL N/A 06/10/2019   Procedure: COLONOSCOPY WITH PROPOFOL;  Surgeon: Yetta Flock, MD;  Location: [facility];  Service: Gastroenterology;  Laterality: N/A;   DILATION AND CURETTAGE OF UTERUS     DILATION AND CURETTAGE OF UTERUS     DILATION AND CURETTAGE OF UTERUS     x 2   ESOPHAGOGASTRODUODENOSCOPY (EGD) WITH PROPOFOL N/A 06/10/2019   Procedure: ESOPHAGOGASTRODUODENOSCOPY (EGD) WITH PROPOFOL;  Surgeon: Yetta Flock, MD;  Location: [facility];  Service: Gastroenterology;  Laterality: N/A;   HERNIA REPAIR     KNEE ARTHROSCOPY Left    PARTIAL HYSTERECTOMY  ovaries still intact   POLYPECTOMY  06/10/2019   Procedure: POLYPECTOMY;  Surgeon: Yetta Flock, MD;  Location: [facility];  Service: Gastroenterology;;   TUBAL LIGATION     Social History   Social History Narrative   Not on file    There is no immunization history on file for this patient.   Objective: Vital Signs: There were no vitals taken for this visit.   Physical Exam   Musculoskeletal Exam: ***  CDAI Exam: CDAI Score: -- Patient Global: --; Provider Global: -- Swollen: --; Tender: -- Joint Exam 04/08/2021   No joint exam has been  documented for this visit   There is currently no information documented on the homunculus. Go to the Rheumatology activity and complete the homunculus joint exam.  Investigation: No additional findings.  Imaging: No results found.  Recent Labs: Lab Results  Component Value Date   WBC 7.3 10/14/2007   HGB 8.3 (L) 10/14/2007   PLT 380 10/14/2007   NA 140 10/14/2007   K 4.0 10/14/2007   CL 104 10/14/2007   CO2 25 10/14/2007   GLUCOSE 96 10/14/2007   BUN 9 10/14/2007   CREATININE 0.68 10/14/2007   CALCIUM 8.8 10/14/2007    Speciality Comments: No specialty comments available.  Procedures:  No procedures performed Allergies: Levaquin [levofloxacin in d5w], Other, Asa [aspirin], Ibuprofen, and Sulfonamide derivatives   Assessment / Plan:     Visit Diagnoses: No diagnosis found.  Orders: No orders of the defined types were placed in this encounter.  No orders of the defined types were placed in this encounter.   Face-to-face time spent with patient was *** minutes. Greater than 50% of time was spent in counseling and coordination of care.  Follow-Up Instructions: No follow-ups on file.   Ofilia Neas, PA-C  Note - This record has been created using Dragon software.  Chart creation errors have been sought, but may not always  have been located. Such creation errors do not reflect on  the standard of medical care.

## 2021-03-28 ENCOUNTER — Ambulatory Visit: Payer: Self-pay | Admitting: Internal Medicine

## 2021-04-08 ENCOUNTER — Ambulatory Visit: Payer: Medicaid Other | Admitting: Rheumatology

## 2021-04-08 DIAGNOSIS — F411 Generalized anxiety disorder: Secondary | ICD-10-CM

## 2021-04-08 DIAGNOSIS — M7541 Impingement syndrome of right shoulder: Secondary | ICD-10-CM

## 2021-04-08 DIAGNOSIS — M79671 Pain in right foot: Secondary | ICD-10-CM

## 2021-04-08 DIAGNOSIS — Z862 Personal history of diseases of the blood and blood-forming organs and certain disorders involving the immune mechanism: Secondary | ICD-10-CM

## 2021-04-08 DIAGNOSIS — Z8719 Personal history of other diseases of the digestive system: Secondary | ICD-10-CM

## 2021-04-08 DIAGNOSIS — Z8709 Personal history of other diseases of the respiratory system: Secondary | ICD-10-CM

## 2021-04-08 DIAGNOSIS — I10 Essential (primary) hypertension: Secondary | ICD-10-CM

## 2021-04-08 DIAGNOSIS — M722 Plantar fascial fibromatosis: Secondary | ICD-10-CM

## 2021-04-22 ENCOUNTER — Telehealth: Payer: Medicaid Other | Admitting: Adult Health

## 2021-05-13 DIAGNOSIS — M79676 Pain in unspecified toe(s): Secondary | ICD-10-CM

## 2021-05-30 NOTE — Progress Notes (Deleted)
Office Visit Note  Patient: Sherry Middleton             Date of Birth: [birth year]           MRN: [id number]             PCP: Earlyne Iba, NP Referring: Landis Martins, DPM Visit Date: 06/09/2021 Occupation: _0 @  Subjective:  No chief complaint on file.   History of Present Illness: Sherry Middleton is a 55 y.o. female ***   Activities of Daily Living:  Patient reports morning stiffness for *** {minute/hour:19697}.   Patient {ACTIONS;DENIES/REPORTS:21021675::"Denies"} nocturnal pain.  Difficulty dressing/grooming: {ACTIONS;DENIES/REPORTS:21021675::"Denies"} Difficulty climbing stairs: {ACTIONS;DENIES/REPORTS:21021675::"Denies"} Difficulty getting out of chair: {ACTIONS;DENIES/REPORTS:21021675::"Denies"} Difficulty using hands for taps, buttons, cutlery, and/or writing: {ACTIONS;DENIES/REPORTS:21021675::"Denies"}  No Rheumatology ROS completed.   PMFS History:  Patient Active Problem List   Diagnosis Date Noted  . Abdominal pain, epigastric   . Dysphagia   . Gastroesophageal reflux disease   . Bloating   . Change in bowel habit   . Murmur, cardiac 04/13/2016  . Morbid (severe) obesity due to excess calories ([place]) 04/13/2016  . Benign essential hypertension 04/13/2016  . Asthma 09/01/2015  . S/P repair of ventral hernia 09/01/2015  . Fibroids 03/30/2015  . Impingement syndrome of right shoulder 12/29/2014  . Generalized anxiety disorder 02/08/2013  . Major depressive disorder, recurrent episode ([place]) 02/08/2013  . Housing or economic circumstance 06/18/2012  . VIRAL GASTROENTERITIS 10/25/2006  . VIRAL URI 10/25/2006  . ANEMIA-IRON DEFICIENCY 09/05/2006  . OBESITY 06/07/2006  . Essential hypertension 06/07/2006  . METRORRHAGIA 06/07/2006  . LATERAL EPICONDYLITIS 06/07/2006    Past Medical History:  Diagnosis Date  . Anal fissure   . Anemia   . Anxiety   . Depression   . GERD (gastroesophageal reflux disease)   . History of weight change   .  Hypertension   . Hypertension   . Palpitations   . Peptic ulcer disease   . Scar   . Swelling     Family History  Problem Relation Age of Onset  . Alcoholism Father   . Esophageal cancer Father   . Heart disease Mother   . Kidney disease Mother   . Breast cancer Sister        1/2 sister  . Diabetes Paternal Grandmother   . Heart disease Paternal Grandmother   . Diabetes Paternal Grandfather   . Crohn's disease Cousin   . Crohn's disease Niece   . Colon cancer Neg Hx   . Pancreatic cancer Neg Hx   . Stomach cancer Neg Hx    Past Surgical History:  Procedure Laterality Date  . BIOPSY  06/10/2019   Procedure: BIOPSY;  Surgeon: Yetta Flock, MD;  Location: [facility];  Service: Gastroenterology;;  . CHOLECYSTECTOMY    . club feet surgery     as a child  . COLONOSCOPY WITH PROPOFOL N/A 06/10/2019   Procedure: COLONOSCOPY WITH PROPOFOL;  Surgeon: Yetta Flock, MD;  Location: [facility];  Service: Gastroenterology;  Laterality: N/A;  . DILATION AND CURETTAGE OF UTERUS    . DILATION AND CURETTAGE OF UTERUS    . DILATION AND CURETTAGE OF UTERUS     x 2  . ESOPHAGOGASTRODUODENOSCOPY (EGD) WITH PROPOFOL N/A 06/10/2019   Procedure: ESOPHAGOGASTRODUODENOSCOPY (EGD) WITH PROPOFOL;  Surgeon: Yetta Flock, MD;  Location: [facility];  Service: Gastroenterology;  Laterality: N/A;  . HERNIA REPAIR    . KNEE ARTHROSCOPY Left   . PARTIAL HYSTERECTOMY  ovaries still intact  . POLYPECTOMY  06/10/2019   Procedure: POLYPECTOMY;  Surgeon: Yetta Flock, MD;  Location: [facility];  Service: Gastroenterology;;  . TUBAL LIGATION     Social History   Social History Narrative  . Not on file    There is no immunization history on file for this patient.   Objective: Vital Signs: There were no vitals taken for this visit.   Physical Exam   Musculoskeletal Exam: ***  CDAI Exam: CDAI Score: -- Patient Global: --; Provider Global: -- Swollen: --;  Tender: -- Joint Exam 06/09/2021   No joint exam has been documented for this visit   There is currently no information documented on the homunculus. Go to the Rheumatology activity and complete the homunculus joint exam.  Investigation: No additional findings.  Imaging: No results found.  Recent Labs: Lab Results  Component Value Date   WBC 7.3 10/14/2007   HGB 8.3 (L) 10/14/2007   PLT 380 10/14/2007   NA 140 10/14/2007   K 4.0 10/14/2007   CL 104 10/14/2007   CO2 25 10/14/2007   GLUCOSE 96 10/14/2007   BUN 9 10/14/2007   CREATININE 0.68 10/14/2007   CALCIUM 8.8 10/14/2007    Speciality Comments: No specialty comments available.  Procedures:  No procedures performed Allergies: Levaquin [levofloxacin in d5w], Other, Asa [aspirin], Ibuprofen, and Sulfonamide derivatives   Assessment / Plan:     Visit Diagnoses: Arthritis of foot - 04/29/20: ANA-, HLA-B27-, RF-, uric acid 5.5, CRP 27.2  Essential hypertension  History of asthma  History of gastroesophageal reflux (GERD)  S/P repair of ventral hernia  Impingement syndrome of right shoulder  Anxiety and depression  Orders: No orders of the defined types were placed in this encounter.  No orders of the defined types were placed in this encounter.   Face-to-face time spent with patient was *** minutes. Greater than 50% of time was spent in counseling and coordination of care.  Follow-Up Instructions: No follow-ups on file.   Ofilia Neas, PA-C  Note - This record has been created using Dragon software.  Chart creation errors have been sought, but may not always  have been located. Such creation errors do not reflect on  the standard of medical care.

## 2021-06-09 ENCOUNTER — Ambulatory Visit: Payer: Medicaid Other | Admitting: Rheumatology

## 2021-06-09 DIAGNOSIS — Z8719 Personal history of other diseases of the digestive system: Secondary | ICD-10-CM

## 2021-06-09 DIAGNOSIS — M19079 Primary osteoarthritis, unspecified ankle and foot: Secondary | ICD-10-CM

## 2021-06-09 DIAGNOSIS — M7541 Impingement syndrome of right shoulder: Secondary | ICD-10-CM

## 2021-06-09 DIAGNOSIS — I10 Essential (primary) hypertension: Secondary | ICD-10-CM

## 2021-06-09 DIAGNOSIS — F419 Anxiety disorder, unspecified: Secondary | ICD-10-CM

## 2021-06-09 DIAGNOSIS — Z8709 Personal history of other diseases of the respiratory system: Secondary | ICD-10-CM

## 2021-06-15 ENCOUNTER — Telehealth: Payer: Self-pay | Admitting: Gastroenterology

## 2021-06-15 MED ORDER — OMEPRAZOLE 40 MG PO CPDR: 40.0000 mg | DELAYED_RELEASE_CAPSULE | Freq: Two times a day (BID) | ORAL | 2 refills | Status: AC

## 2021-06-15 NOTE — Telephone Encounter (Signed)
Spoke with patient in regards to information. Pt also requested refill of Omeprazole, this has been sent in for her. Pt had no concerns at the end of the call.

## 2021-06-15 NOTE — Telephone Encounter (Signed)
Spoke with patient, she states that her PCP started her on Eliquis. She has been on Eliquis 5 mg BID for about 2 weeks. Pt denies any concerning symptoms of bleeding, only the easy bruising. She states that she feels fine otherwise. Pt just wants to make sure this is OK to take due to her history of ulcers. Please advise, thanks

## 2021-06-15 NOTE — Telephone Encounter (Signed)
Inbound call from patient requesting a call from a nurse.  States PCP wants to make sure she can start on a certain medication due to the ulcers she had.  Please advise.

## 2021-06-15 NOTE — Telephone Encounter (Signed)
Unless she has had an endoscopy elsewhere, her last EGD with me within the past year or 2 showed no ulcers in her bowel.  If she is taking omeprazole for her reflux then I think okay to take her Eliquis and her risk for ulcers should be low, assuming she is not having any gastrointestinal bleeding symptoms.  If she is having bleeding symptoms she needs to let us know.  She should avoid NSAIDs while on Eliquis and use Tylenol as needed for pain.  She can follow-up with me in the office if she has more concerns about this or wants to discuss it further

## 2021-07-08 ENCOUNTER — Other Ambulatory Visit: Payer: Self-pay

## 2021-07-08 ENCOUNTER — Encounter: Payer: Self-pay | Admitting: Adult Health

## 2021-07-08 ENCOUNTER — Ambulatory Visit (INDEPENDENT_AMBULATORY_CARE_PROVIDER_SITE_OTHER): Payer: Self-pay | Admitting: Adult Health

## 2021-07-08 DIAGNOSIS — F411 Generalized anxiety disorder: Secondary | ICD-10-CM

## 2021-07-08 DIAGNOSIS — F331 Major depressive disorder, recurrent, moderate: Secondary | ICD-10-CM

## 2021-07-08 DIAGNOSIS — F410 Panic disorder [episodic paroxysmal anxiety] without agoraphobia: Secondary | ICD-10-CM

## 2021-07-08 MED ORDER — ESCITALOPRAM OXALATE 20 MG PO TABS: ORAL_TABLET | ORAL | 5 refills | Status: DC

## 2021-07-08 MED ORDER — ALPRAZOLAM 1 MG PO TABS: ORAL_TABLET | ORAL | 2 refills | Status: DC

## 2021-07-08 NOTE — Progress Notes (Signed)
Sherry Middleton [id number] 1966-02-11 55 y.o.  Subjective:   Patient ID:  Sherry Middleton is a 55 y.o. (DOB 11-01-1965) female.  Chief Complaint: No chief complaint on file.   HPI Sherry Middleton presents to the office today for follow-up of depression, anxiety, panic attacks.   Describes mood today as "hanging in there". Pleasant. Tearful at times. Mood symptoms - reports depression, anxiety, and irritability. Reports panic attacks. Feels like Lexapro and Xanax are helpful for mood symptoms. Increased pain issues. Recently approved for disability and receiving benefits. Still struggling to manage financial obligations. Ongoing pain issues. Suffering from migraines. Working on self care - can still go several days without washing her hair.Having migraines. Struggles to leave home some days. Working with a Transport planner and feels it is helpful. Varying interest and motivation. Taking medications as prescribe. Awaiting disability determination. Energy levels low. Recovering from Covid. Active, does not have a regular exercise routine with physical disabilities.  Enjoys some usual interests and activities. Married. Lives with husband and 2 dogs - puppies. Spending time with family - 3 sons. Appetite adequate. Suffers from IBS. Weight gain. Sleeping better some nights than others. Averages 6 to 7 hours - broken sleep.  Focus and concentration difficulties. Completing tasks. Managing some aspects of household - "doing some things around the house". Trying to stay on a routine. Unable to work with current physical and emotional limitations. Denies SI or HI.  Denies AH or VH. 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  Review of Systems:  Review of Systems  Musculoskeletal:  Negative for gait problem.  Neurological:  Negative for tremors.  Psychiatric/Behavioral:         Please refer to HPI   Medications: I have reviewed the patient's current medications.  Current Outpatient Medications   Medication Sig Dispense Refill   ALPRAZolam (XANAX) 1 MG tablet Take 1 tablet three times daily prn anxiety 90 tablet 2   ascorbic acid (VITAMIN C) 500 MG tablet Take by mouth.     colestipol (COLESTID) 1 g tablet Take 1 tablet (1 gram) by mouth once to twice a day as needed 60 tablet 3   dicyclomine (BENTYL) 10 MG capsule Take 1-2 capsules (10-20 mg total) by mouth every 8 (eight) hours as needed for spasms. 90 capsule 1   DUEXIS 800-26.6 MG TABS Take 1 tablet by mouth every 8 (eight) hours as needed (Pain).      EPINEPHrine 0.3 mg/0.3 mL IJ SOAJ injection SMARTSIG:1 Pre-Filled Pen Syringe IM PRN     escitalopram (LEXAPRO) 10 MG tablet Take one and 1/2 tablets daily. 45 tablet 5   furosemide (LASIX) 20 MG tablet Take 20 mg by mouth daily.      ibuprofen (ADVIL) 800 MG tablet Take 800 mg by mouth 3 (three) times daily as needed.     lisinopril (ZESTRIL) 20 MG tablet Take 20 mg by mouth daily.      lisinopril-hydrochlorothiazide (PRINZIDE,ZESTORETIC) 20-12.5 MG per tablet Take 1 tablet by mouth daily.      omeprazole (PRILOSEC) 40 MG capsule Take 1 capsule (40 mg total) by mouth 2 (two) times daily. 180 capsule 2   ondansetron (ZOFRAN ODT) 4 MG disintegrating tablet Take 1 tablet (4 mg total) by mouth every 6 (six) hours as needed for nausea or vomiting. 90 tablet 3   polyethylene glycol powder (GLYCOLAX/MIRALAX) 17 GM/SCOOP powder Take 0.5 Containers by mouth daily as needed for mild constipation or moderate constipation. As needed     Probiotic Product (ALIGN) 4 MG  CAPS Take 4 mg by mouth daily.     Scar GEL Apply 1 application topically daily as needed (Scar). As needed     spironolactone (ALDACTONE) 25 MG tablet Take 25 mg by mouth daily.      triamcinolone cream (KENALOG) 0.1 % Apply topically 2 (two) times daily.     valsartan-hydrochlorothiazide (DIOVAN-HCT) 160-12.5 MG tablet Take 1 tablet by mouth daily.     Vitamin D, Ergocalciferol, (DRISDOL) 1.25 MG (50000 UT) CAPS capsule Take  50,000 Units by mouth 2 (two) times a week.      Wheat Dextrin (BENEFIBER DRINK MIX PO) Take 1 Package by mouth daily as needed (constipation).     No current facility-administered medications for this visit.    Medication Side Effects: None  Allergies:  Allergies  Allergen Reactions   Levaquin [Levofloxacin In D5w] Shortness Of Breath    avalox and cipro    Other Other (See Comments)    Steroids Hyper sensitive/palpitations   Asa [Aspirin] Other (See Comments)    Ulcers   Ibuprofen Other (See Comments)    Ulcers   Sulfonamide Derivatives Other (See Comments)    Finger nails turn blue    Past Medical History:  Diagnosis Date   Anal fissure    Anemia    Anxiety    Depression    GERD (gastroesophageal reflux disease)    History of weight change    Hypertension    Hypertension    Palpitations    Peptic ulcer disease    Scar    Swelling     Past Medical History, Surgical history, Social history, and Family history were reviewed and updated as appropriate.   Please see review of systems for further details on the patient's review from today.   Objective:   Physical Exam:  There were no vitals taken for this visit.  Physical Exam Constitutional:      General: She is not in acute distress. Musculoskeletal:        General: No deformity.  Neurological:     Mental Status: She is alert and oriented to person, place, and time.     Coordination: Coordination normal.  Psychiatric:        Attention and Perception: Attention and perception normal. She does not perceive auditory or visual hallucinations.        Mood and Affect: Mood normal. Mood is not anxious or depressed. Affect is not labile, blunt, angry or inappropriate.        Speech: Speech normal.        Behavior: Behavior normal.        Thought Content: Thought content normal. Thought content is not paranoid or delusional. Thought content does not include homicidal or suicidal ideation. Thought content does not  include homicidal or suicidal plan.        Cognition and Memory: Cognition and memory normal.        Judgment: Judgment normal.     Comments: Insight intact    Lab Review:     Component Value Date/Time   NA 140 10/14/2007 2047   K 4.0 10/14/2007 2047   CL 104 10/14/2007 2047   CO2 25 10/14/2007 2047   GLUCOSE 96 10/14/2007 2047   BUN 9 10/14/2007 2047   CREATININE 0.68 10/14/2007 2047   CALCIUM 8.8 10/14/2007 2047       Component Value Date/Time   WBC 7.3 10/14/2007 2047   RBC 3.98 10/14/2007 2047   HGB 8.3 (L) 10/14/2007 2047  HCT 30.3 (L) 10/14/2007 2047   PLT 380 10/14/2007 2047   MCV 76.1 (L) 10/14/2007 2047   MCHC 27.4 (L) 10/14/2007 2047   RDW 21.9 (H) 10/14/2007 2047   LYMPHSABS 2.0 10/25/2006 1625   MONOABS 0.5 10/25/2006 1625   EOSABS 0.1 10/25/2006 1625   BASOSABS 0.0 10/25/2006 1625    No results found for: POCLITH, LITHIUM   No results found for: PHENYTOIN, PHENOBARB, VALPROATE, CBMZ   .res Assessment: Plan:    Plan:  1. Continue Lexapro 20mg  daily  2. Continue Xanax 1mg  TID  Consider Cymbalta or Wellbutrin   Continue therapy Sherry Middleton  Patient disabled and unable to work at this time with mental health and physical issues..  Discussed potential benefits, risk, and side effects of benzodiazepines to include potential risk of tolerance and dependence, as well as possible drowsiness.  Advised patient not to drive if experiencing drowsiness and to take lowest possible effective dose to minimize risk of dependence and tolerance.  RTC 4/6 weeks   Diagnoses and all orders for this visit:  Panic attacks  Generalized anxiety disorder  Major depressive disorder, recurrent episode, moderate ([place])    Please see After Visit Summary for patient specific instructions.  No future appointments.  No orders of the defined types were placed in this encounter.   -------------------------------

## 2021-08-18 ENCOUNTER — Other Ambulatory Visit: Payer: Self-pay

## 2021-08-18 DIAGNOSIS — I872 Venous insufficiency (chronic) (peripheral): Secondary | ICD-10-CM

## 2021-09-01 ENCOUNTER — Encounter (HOSPITAL_COMMUNITY): Payer: Medicaid Other

## 2021-09-12 ENCOUNTER — Ambulatory Visit (HOSPITAL_COMMUNITY): Payer: Medicaid Other | Attending: Vascular Surgery

## 2021-10-13 DIAGNOSIS — I4891 Unspecified atrial fibrillation: Secondary | ICD-10-CM | POA: Diagnosis not present

## 2021-10-13 DIAGNOSIS — Z7901 Long term (current) use of anticoagulants: Secondary | ICD-10-CM | POA: Diagnosis not present

## 2021-10-13 DIAGNOSIS — Z9981 Dependence on supplemental oxygen: Secondary | ICD-10-CM | POA: Diagnosis not present

## 2021-10-13 DIAGNOSIS — I10 Essential (primary) hypertension: Secondary | ICD-10-CM | POA: Diagnosis not present

## 2021-10-13 DIAGNOSIS — R9431 Abnormal electrocardiogram [ECG] [EKG]: Secondary | ICD-10-CM | POA: Diagnosis not present

## 2021-10-20 DIAGNOSIS — I739 Peripheral vascular disease, unspecified: Secondary | ICD-10-CM | POA: Diagnosis not present

## 2021-10-20 DIAGNOSIS — I872 Venous insufficiency (chronic) (peripheral): Secondary | ICD-10-CM | POA: Diagnosis not present

## 2021-10-20 DIAGNOSIS — Z79899 Other long term (current) drug therapy: Secondary | ICD-10-CM | POA: Diagnosis not present

## 2021-10-20 DIAGNOSIS — I10 Essential (primary) hypertension: Secondary | ICD-10-CM | POA: Diagnosis not present

## 2021-10-20 DIAGNOSIS — K589 Irritable bowel syndrome without diarrhea: Secondary | ICD-10-CM | POA: Diagnosis not present

## 2021-10-20 DIAGNOSIS — G9332 Myalgic encephalomyelitis/chronic fatigue syndrome: Secondary | ICD-10-CM | POA: Diagnosis not present

## 2021-10-20 DIAGNOSIS — M159 Polyosteoarthritis, unspecified: Secondary | ICD-10-CM | POA: Diagnosis not present

## 2021-10-20 DIAGNOSIS — R600 Localized edema: Secondary | ICD-10-CM | POA: Diagnosis not present

## 2021-10-20 DIAGNOSIS — E782 Mixed hyperlipidemia: Secondary | ICD-10-CM | POA: Diagnosis not present

## 2021-10-20 DIAGNOSIS — F32A Depression, unspecified: Secondary | ICD-10-CM | POA: Diagnosis not present

## 2021-10-20 DIAGNOSIS — E559 Vitamin D deficiency, unspecified: Secondary | ICD-10-CM | POA: Diagnosis not present

## 2021-10-22 DIAGNOSIS — Z9981 Dependence on supplemental oxygen: Secondary | ICD-10-CM | POA: Diagnosis not present

## 2021-11-02 DIAGNOSIS — H5500 Unspecified nystagmus: Secondary | ICD-10-CM | POA: Diagnosis not present

## 2021-11-02 DIAGNOSIS — H68003 Unspecified Eustachian salpingitis, bilateral: Secondary | ICD-10-CM | POA: Diagnosis not present

## 2021-11-03 DIAGNOSIS — E782 Mixed hyperlipidemia: Secondary | ICD-10-CM | POA: Diagnosis not present

## 2021-11-03 DIAGNOSIS — Z79899 Other long term (current) drug therapy: Secondary | ICD-10-CM | POA: Diagnosis not present

## 2021-11-03 DIAGNOSIS — E559 Vitamin D deficiency, unspecified: Secondary | ICD-10-CM | POA: Diagnosis not present

## 2021-11-07 ENCOUNTER — Other Ambulatory Visit (HOSPITAL_BASED_OUTPATIENT_CLINIC_OR_DEPARTMENT_OTHER): Payer: Self-pay

## 2021-11-07 DIAGNOSIS — R0683 Snoring: Secondary | ICD-10-CM

## 2021-11-14 ENCOUNTER — Ambulatory Visit (INDEPENDENT_AMBULATORY_CARE_PROVIDER_SITE_OTHER): Payer: Medicare Other | Admitting: Surgery

## 2021-11-14 ENCOUNTER — Ambulatory Visit (HOSPITAL_COMMUNITY)
Admission: RE | Admit: 2021-11-14 | Discharge: 2021-11-14 | Disposition: A | Discharge status: Home / Self Care | Payer: Medicare Other | Source: Ambulatory Visit | Attending: Surgery | Admitting: Surgery

## 2021-11-14 ENCOUNTER — Encounter: Payer: Self-pay | Admitting: Surgery

## 2021-11-14 ENCOUNTER — Other Ambulatory Visit: Payer: Self-pay

## 2021-11-14 VITALS — BP 124/61 | HR 55 | Temp 98.4°F | Resp 20 | Ht 65.5 in | Wt 372.0 lb

## 2021-11-14 DIAGNOSIS — M7989 Other specified soft tissue disorders: Secondary | ICD-10-CM

## 2021-11-14 DIAGNOSIS — I872 Venous insufficiency (chronic) (peripheral): Secondary | ICD-10-CM | POA: Diagnosis not present

## 2021-11-14 NOTE — Progress Notes (Signed)
[facility]ist of [facility]  Patient name: Sherry Middleton MRN: [id number] DOB: 02-24-1966 Sex: female   REQUESTING PROVIDER:    Nicholos Johns, MD   REASON FOR CONSULT:    Varicose veins  HISTORY OF PRESENT ILLNESS:   Sherry Middleton is a 56 y.o. female, who is referred for evaluation of leg swelling.  The patient states that she has been dealing with this for a long time but has gotten worse over the past 6 months.  Her left leg bothers her more than the right.  She complains of leg swelling and soreness and discomfort.  She has tried to wear compression stockings however they cut off her circulation at the knee.  She has worn compression wraps which did help.  She does a good job of elevating her legs particularly at night.  When elevating her legs, the fluid does go down.  She is trying to limit her salt intake.  She is interested in weight loss.  The patient is been diagnosed with prediabetes.  She is medically managed for hypertension.  She has hypercholesterolemia.  She is on Eliquis  PAST MEDICAL HISTORY    Past Medical History:  Diagnosis Date   Anal fissure    Anemia    Anxiety    Depression    GERD (gastroesophageal reflux disease)    History of weight change    Hypertension    Hypertension    Palpitations    Peptic ulcer disease    Scar    Swelling      FAMILY HISTORY   Family History  Problem Relation Age of Onset   Alcoholism Father    Esophageal cancer Father    Heart disease Mother    Kidney disease Mother    Breast cancer Sister        1/2 sister   Diabetes Paternal Grandmother    Heart disease Paternal Grandmother    Diabetes Paternal Grandfather    Crohn's disease Cousin    Crohn's disease Niece    Colon cancer Neg Hx    Pancreatic cancer Neg Hx    Stomach cancer Neg Hx     SOCIAL HISTORY:   Social History   Socioeconomic History   Marital status: Married    Spouse name: Not on  file   Number of children: Not on file   Years of education: Not on file   Highest education level: Not on file  Occupational History   Not on file  Tobacco Use   Smoking status: Never   Smokeless tobacco: Never  Vaping Use   Vaping Use: Never used  Substance and Sexual Activity   Alcohol use: No   Drug use: No   Sexual activity: Not on file  Other Topics Concern   Not on file  Social History Narrative   Not on file   Social Determinants of Health   Financial Resource Strain: Not on file  Food Insecurity: Not on file  Transportation Needs: Not on file  Physical Activity: Not on file  Stress: Not on file  Social Connections: Not on file  Intimate Partner Violence: Not on file    ALLERGIES:    Allergies  Allergen Reactions   Levaquin [Levofloxacin In D5w] Shortness Of Breath    avalox and cipro    Levofloxacin Anaphylaxis and Shortness Of Breath    avalox and cipro  avalox and cipro    Prednisone Palpitations    * pt reports she can take tapered doses *  pt reports she can take tapered doses * pt reports she can take tapered doses   Other Other (See Comments)    Steroids Hyper sensitive/palpitations   Asa [Aspirin] Other (See Comments)    Ulcers   Ibuprofen Other (See Comments)    Ulcers   Sulfonamide Derivatives Other (See Comments)    Finger nails turn blue    CURRENT MEDICATIONS:    Current Outpatient Medications  Medication Sig Dispense Refill   ALPRAZolam (XANAX) 1 MG tablet Take 1 tablet three times daily prn anxiety 90 tablet 2   amiodarone (PACERONE) 200 MG tablet Take 200 mg by mouth daily.     ascorbic acid (VITAMIN C) 500 MG tablet Take by mouth.     colestipol (COLESTID) 1 g tablet Take 1 tablet (1 gram) by mouth once to twice a day as needed 60 tablet 3   dicyclomine (BENTYL) 10 MG capsule Take 1-2 capsules (10-20 mg total) by mouth every 8 (eight) hours as needed for spasms. 90 capsule 1   DUEXIS 800-26.6 MG TABS Take 1 tablet by mouth  every 8 (eight) hours as needed (Pain).      ELIQUIS 5 MG TABS tablet Take 5 mg by mouth 2 (two) times daily.     EPINEPHrine 0.3 mg/0.3 mL IJ SOAJ injection SMARTSIG:1 Pre-Filled Pen Syringe IM PRN     escitalopram (LEXAPRO) 20 MG tablet Take one tablet daily. 30 tablet 5   furosemide (LASIX) 20 MG tablet Take 20 mg by mouth daily.      ibuprofen (ADVIL) 800 MG tablet Take 800 mg by mouth 3 (three) times daily as needed.     metoprolol tartrate (LOPRESSOR) 25 MG tablet Take 25 mg by mouth 2 (two) times daily.     montelukast (SINGULAIR) 10 MG tablet Take 10 mg by mouth at bedtime.     omeprazole (PRILOSEC) 40 MG capsule Take 1 capsule (40 mg total) by mouth 2 (two) times daily. 180 capsule 2   ondansetron (ZOFRAN ODT) 4 MG disintegrating tablet Take 1 tablet (4 mg total) by mouth every 6 (six) hours as needed for nausea or vomiting. 90 tablet 3   polyethylene glycol powder (GLYCOLAX/MIRALAX) 17 GM/SCOOP powder Take 0.5 Containers by mouth daily as needed for mild constipation or moderate constipation. As needed     Probiotic Product (ALIGN) 4 MG CAPS Take 4 mg by mouth daily.     spironolactone (ALDACTONE) 25 MG tablet Take 25 mg by mouth daily.      valsartan-hydrochlorothiazide (DIOVAN-HCT) 160-12.5 MG tablet Take 1 tablet by mouth daily.     Vitamin D, Ergocalciferol, (DRISDOL) 1.25 MG (50000 UT) CAPS capsule Take 50,000 Units by mouth 2 (two) times a week.      Wheat Dextrin (BENEFIBER DRINK MIX PO) Take 1 Package by mouth daily as needed (constipation).     No current facility-administered medications for this visit.    REVIEW OF SYSTEMS:   '[X]'$  denotes positive finding, '[ ]'$  denotes negative finding Cardiac  Comments:  Chest pain or chest pressure:    Shortness of breath upon exertion:    Short of breath when lying flat:    Irregular heart rhythm:        Vascular    Pain in calf, thigh, or hip brought on by ambulation:    Pain in feet at night that wakes you up from your sleep:      Blood clot in your veins:    Leg swelling:  x  Pulmonary    Oxygen at home:    Productive cough:     Wheezing:         Neurologic    Sudden weakness in arms or legs:     Sudden numbness in arms or legs:     Sudden onset of difficulty speaking or slurred speech:    Temporary loss of vision in one eye:     Problems with dizziness:         Gastrointestinal    Blood in stool:      Vomited blood:         Genitourinary    Burning when urinating:     Blood in urine:        Psychiatric    Major depression:         Hematologic    Bleeding problems:    Problems with blood clotting too easily:        Skin    Rashes or ulcers:        Constitutional    Fever or chills:     PHYSICAL EXAM:   Vitals:   11/14/21 1422  BP: 124/61  Pulse: (!) 55  Resp: 20  Temp: 98.4 F (36.9 C)  SpO2: 94%  Weight: (!) 372 lb (168.7 kg)  Height: 5' 5.5" (1.664 m)    GENERAL: The patient is a well-nourished female, in no acute distress. The vital signs are documented above. CARDIAC: There is a regular rate and rhythm.  VASCULAR: Minimal pitting edema bilaterally PULMONARY: Nonlabored respirations MUSCULOSKELETAL: There are no major deformities or cyanosis. NEUROLOGIC: No focal weakness or paresthesias are detected. SKIN: There are no ulcers or rashes noted. PSYCHIATRIC: The patient has a normal affect.   STUDIES:   I have reviewed the following: +---------------------------+---------+------+-----------+------------+----  ----+   LEFT                        Reflux No Reflux Reflux Time Diameter  cms Comments                                           Yes                                         +---------------------------+---------+------+-----------+------------+----  ----+   CFV                                           >1 second                            +---------------------------+---------+------+-----------+------------+----  ----+   FV mid                      no                                                      +---------------------------+---------+------+-----------+------------+----  ----+   Popliteal  no                                                     +---------------------------+---------+------+-----------+------------+----  ----+   GSV at [facility] [address]                             yes     >500 ms       0.68                  +---------------------------+---------+------+-----------+------------+----  ----+   GSV prox thigh              no                               0.72                  +---------------------------+---------+------+-----------+------------+----  ----+   GSV mid thigh               no                               0.48                  +---------------------------+---------+------+-----------+------------+----  ----+   GSV dist thigh              no                               0.42                  +---------------------------+---------+------+-----------+------------+----  ----+   GSV at knee                 no                               0.42                  +---------------------------+---------+------+-----------+------------+----  ----+   GSV prox calf               no                               0.37                  +---------------------------+---------+------+-----------+------------+----  ----+   SSV Pop Fossa               no                               0.36                  +---------------------------+---------+------+-----------+------------+----  ----+   SSV prox calf               no                               0.26                  +---------------------------+---------+------+-----------+------------+----  ----+  SSV mid calf                no                               0.27                  +---------------------------+---------+------+-----------+------------+----  ----+   Anterior accessory                     yes     >500 ms       0.40                    saphenous vein                                                                     +---------------------------+---------+------+-----------+------------+----  ----+   ASSESSMENT and PLAN   Bilateral leg swelling: I discussed with the patient that her venous insufficiency evaluation was relatively unremarkable.  This means that she most likely is dealing with lymphedema.  I think she is doing a good job of managing it with her current activities of leg elevation, compression wraps, and plans for weight loss.  I am making a referral to the lymphedema clinic.  I think she would be a good candidate for lymphedema pumps.  I am also trying to get her into the weight loss clinic.  She is not quite ready to entertain surgical options for weight loss but there may be other nutritional options.   Leia Alf, MD, FACS [facility] Specialists of [facility] [phone] Pager [phone]

## 2021-11-17 ENCOUNTER — Telehealth (INDEPENDENT_AMBULATORY_CARE_PROVIDER_SITE_OTHER): Payer: Medicare Other | Admitting: Adult Health

## 2021-11-17 ENCOUNTER — Encounter: Payer: Self-pay | Admitting: Adult Health

## 2021-11-17 DIAGNOSIS — F410 Panic disorder [episodic paroxysmal anxiety] without agoraphobia: Secondary | ICD-10-CM

## 2021-11-17 DIAGNOSIS — F331 Major depressive disorder, recurrent, moderate: Secondary | ICD-10-CM

## 2021-11-17 DIAGNOSIS — F411 Generalized anxiety disorder: Secondary | ICD-10-CM | POA: Diagnosis not present

## 2021-11-17 DIAGNOSIS — F422 Mixed obsessional thoughts and acts: Secondary | ICD-10-CM | POA: Diagnosis not present

## 2021-11-17 DIAGNOSIS — G4700 Insomnia, unspecified: Secondary | ICD-10-CM

## 2021-11-17 MED ORDER — ALPRAZOLAM 1 MG PO TABS: ORAL_TABLET | ORAL | 2 refills | Status: DC

## 2021-11-17 MED ORDER — ESCITALOPRAM OXALATE 20 MG PO TABS: ORAL_TABLET | ORAL | 5 refills | Status: DC

## 2021-11-17 NOTE — Progress Notes (Signed)
Sherry Middleton ?[id number] ?Mar 23, 1966 ?56 y.o. ? ?Virtual Visit via Video Note ? ?I connected with pt @ on 11/17/21 at  2:00 PM EDT by a video enabled telemedicine application and verified that I am speaking with the correct person using two identifiers. ?  ?I discussed the limitations of evaluation and management by telemedicine and the availability of in person appointments. The patient expressed understanding and agreed to proceed. ? ?I discussed the assessment and treatment plan with the patient. The patient was provided an opportunity to ask questions and all were answered. The patient agreed with the plan and demonstrated an understanding of the instructions. ?  ?The patient was advised to call back or seek an in-person evaluation if the symptoms worsen or if the condition fails to improve as anticipated. ? ?I provided 25 minutes of non-face-to-face time during this encounter.  The patient was located at home.  The provider was located at [place]. ? ? ?Aloha Gell, NP  ? ?Subjective:  ? ?Patient ID:  Sherry Middleton is a 56 y.o. (DOB Nov 02, 1965) female. ? ?Chief Complaint: No chief complaint on file. ? ? ?HPI ?[name] presents for follow-up of depression, anxiety, insomnia, obsessional thoughts and actpanic attacks.  ? ?Describes mood today as "hanging in there". Pleasant. Tearful at times. Mood symptoms - reports depression, anxiety, and irritability. Reports panic attacks. Stating "I have moments of hope, and days that are a struggle". Feels about "half and half". Has been having to drive more - still having some anxiety - not as bad if she is familiar with route. Reports chronic pain issues - "it's a daily thing" legs and knees hurting all the time. Reports financial stressors - improved since receiving disability benefits - still not able to cover all her bills. Reports upcoming sleep study - has started using oxygen at night. Started having A-fib after Covid - started  on Amniodorone and Eliquis. Decreased migraines. Working on self care - hasn't comber her hair in 2 days. Trying to make herself get up and take care of herself. Has been in gown for 3 days. Not doing much around the house - "I'm trying". Working with a Transport planner and feels it is helpful. Varying interest and motivation. Taking medications as prescribe.  ?Energy levels low. Active, does not have a regular exercise routine with physical disabilities.  ?Enjoys some usual interests and activities. Married. Lives with husband and 3 dogs - 2 cats. Spending time with family - 3 sons. ?Appetite adequate. Suffers from IBS. Working on weight loss - "it's really hard for me". ?Sleeping better some nights than others. Averages 6 to 7 hours - broken sleep.  ?Focus and concentration difficulties. Completing tasks. Managing some aspects of household - "doing some things around the house". Trying to stay on a routine. Unable to work with current physical and emotional limitations. ?Denies SI or HI.  ?Denies AH or VH. ?Therapist Dena Billet - [facility] - seeing weekly. ? ? ?Review of Systems:  ?Review of Systems  ?Musculoskeletal:  Negative for gait problem.  ?Neurological:  Negative for tremors.  ?Psychiatric/Behavioral:    ?     Please refer to HPI  ? ?Medications: I have reviewed the patient's current medications. ? ?Current Outpatient Medications  ?Medication Sig Dispense Refill  ? ALPRAZolam (XANAX) 1 MG tablet Take 1 tablet three times daily prn anxiety 90 tablet 2  ? amiodarone (PACERONE) 200 MG tablet Take 200 mg by mouth daily.    ? ascorbic acid (VITAMIN C)  500 MG tablet Take by mouth.    ? colestipol (COLESTID) 1 g tablet Take 1 tablet (1 gram) by mouth once to twice a day as needed 60 tablet 3  ? dicyclomine (BENTYL) 10 MG capsule Take 1-2 capsules (10-20 mg total) by mouth every 8 (eight) hours as needed for spasms. 90 capsule 1  ? DUEXIS 800-26.6 MG TABS Take 1 tablet by mouth every 8 (eight) hours as needed  (Pain).     ? ELIQUIS 5 MG TABS tablet Take 5 mg by mouth 2 (two) times daily.    ? EPINEPHrine 0.3 mg/0.3 mL IJ SOAJ injection SMARTSIG:1 Pre-Filled Pen Syringe IM PRN    ? escitalopram (LEXAPRO) 20 MG tablet Take one tablet daily. 30 tablet 5  ? furosemide (LASIX) 20 MG tablet Take 20 mg by mouth daily.     ? ibuprofen (ADVIL) 800 MG tablet Take 800 mg by mouth 3 (three) times daily as needed.    ? metoprolol tartrate (LOPRESSOR) 25 MG tablet Take 25 mg by mouth 2 (two) times daily.    ? montelukast (SINGULAIR) 10 MG tablet Take 10 mg by mouth at bedtime.    ? omeprazole (PRILOSEC) 40 MG capsule Take 1 capsule (40 mg total) by mouth 2 (two) times daily. 180 capsule 2  ? ondansetron (ZOFRAN ODT) 4 MG disintegrating tablet Take 1 tablet (4 mg total) by mouth every 6 (six) hours as needed for nausea or vomiting. 90 tablet 3  ? polyethylene glycol powder (GLYCOLAX/MIRALAX) 17 GM/SCOOP powder Take 0.5 Containers by mouth daily as needed for mild constipation or moderate constipation. As needed    ? Probiotic Product (ALIGN) 4 MG CAPS Take 4 mg by mouth daily.    ? spironolactone (ALDACTONE) 25 MG tablet Take 25 mg by mouth daily.     ? valsartan-hydrochlorothiazide (DIOVAN-HCT) 160-12.5 MG tablet Take 1 tablet by mouth daily.    ? Vitamin D, Ergocalciferol, (DRISDOL) 1.25 MG (50000 UT) CAPS capsule Take 50,000 Units by mouth 2 (two) times a week.     ? Wheat Dextrin (BENEFIBER DRINK MIX PO) Take 1 Package by mouth daily as needed (constipation).    ? ?No current facility-administered medications for this visit.  ? ? ?Medication Side Effects: None ? ?Allergies:  ?Allergies  ?Allergen Reactions  ? Levaquin [Levofloxacin In D5w] Shortness Of Breath  ?  avalox and cipro   ? Levofloxacin Anaphylaxis and Shortness Of Breath  ?  avalox and cipro  ?avalox and cipro   ? Prednisone Palpitations  ?  * pt reports she can take tapered doses ?* pt reports she can take tapered doses ?* pt reports she can take tapered doses  ? Other  Other (See Comments)  ?  Steroids ?Hyper sensitive/palpitations  ? Asa [Aspirin] Other (See Comments)  ?  Ulcers  ? Ibuprofen Other (See Comments)  ?  Ulcers  ? Sulfonamide Derivatives Other (See Comments)  ?  Finger nails turn blue  ? ? ?Past Medical History:  ?Diagnosis Date  ? Anal fissure   ? Anemia   ? Anxiety   ? Depression   ? GERD (gastroesophageal reflux disease)   ? History of weight change   ? Hypertension   ? Hypertension   ? Palpitations   ? Peptic ulcer disease   ? Scar   ? Swelling   ? ? ?Family History  ?Problem Relation Age of Onset  ? Alcoholism Father   ? Esophageal cancer Father   ? Heart disease Mother   ?  Kidney disease Mother   ? Breast cancer Sister   ?     1/2 sister  ? Diabetes Paternal Grandmother   ? Heart disease Paternal Grandmother   ? Diabetes Paternal Grandfather   ? Crohn's disease Cousin   ? Crohn's disease Niece   ? Colon cancer Neg Hx   ? Pancreatic cancer Neg Hx   ? Stomach cancer Neg Hx   ? ? ?Social History  ? ?Socioeconomic History  ? Marital status: Married  ?  Spouse name: Not on file  ? Number of children: Not on file  ? Years of education: Not on file  ? Highest education level: Not on file  ?Occupational History  ? Not on file  ?Tobacco Use  ? Smoking status: Never  ? Smokeless tobacco: Never  ?Vaping Use  ? Vaping Use: Never used  ?Substance and Sexual Activity  ? Alcohol use: No  ? Drug use: No  ? Sexual activity: Not on file  ?Other Topics Concern  ? Not on file  ?Social History Narrative  ? Not on file  ? ?Social Determinants of Health  ? ?Financial Resource Strain: Not on file  ?Food Insecurity: Not on file  ?Transportation Needs: Not on file  ?Physical Activity: Not on file  ?Stress: Not on file  ?Social Connections: Not on file  ?Intimate Partner Violence: Not on file  ? ? ?Past Medical History, Surgical history, Social history, and Family history were reviewed and updated as appropriate.  ? ?Please see review of systems for further details on the patient's review  from today.  ? ?Objective:  ? ?Physical Exam:  ?There were no vitals taken for this visit. ? ?Physical Exam ?Constitutional:   ?   General: She is not in acute distress. ?Musculoskeletal:     ?   General:

## 2021-11-19 DIAGNOSIS — Z9981 Dependence on supplemental oxygen: Secondary | ICD-10-CM | POA: Diagnosis not present

## 2021-12-20 DIAGNOSIS — Z9981 Dependence on supplemental oxygen: Secondary | ICD-10-CM | POA: Diagnosis not present

## 2021-12-26 ENCOUNTER — Ambulatory Visit (HOSPITAL_BASED_OUTPATIENT_CLINIC_OR_DEPARTMENT_OTHER): Payer: Medicare Other | Attending: Internal Medicine | Admitting: Internal Medicine

## 2021-12-26 DIAGNOSIS — R0683 Snoring: Secondary | ICD-10-CM

## 2022-01-19 DIAGNOSIS — Z9981 Dependence on supplemental oxygen: Secondary | ICD-10-CM | POA: Diagnosis not present

## 2022-02-10 ENCOUNTER — Telehealth: Payer: Self-pay | Admitting: Adult Health

## 2022-02-10 NOTE — Telephone Encounter (Signed)
Pharmacy updated.

## 2022-02-10 NOTE — Telephone Encounter (Signed)
Patient lvm stating she has a new pharmacy. Please update. [facility] [place].

## 2022-02-19 DIAGNOSIS — Z9981 Dependence on supplemental oxygen: Secondary | ICD-10-CM | POA: Diagnosis not present

## 2022-03-03 ENCOUNTER — Encounter: Payer: Self-pay | Admitting: Adult Health

## 2022-03-03 ENCOUNTER — Telehealth (INDEPENDENT_AMBULATORY_CARE_PROVIDER_SITE_OTHER): Payer: Medicare Other | Admitting: Adult Health

## 2022-03-03 DIAGNOSIS — F422 Mixed obsessional thoughts and acts: Secondary | ICD-10-CM | POA: Diagnosis not present

## 2022-03-03 DIAGNOSIS — F410 Panic disorder [episodic paroxysmal anxiety] without agoraphobia: Secondary | ICD-10-CM | POA: Diagnosis not present

## 2022-03-03 DIAGNOSIS — F331 Major depressive disorder, recurrent, moderate: Secondary | ICD-10-CM

## 2022-03-03 DIAGNOSIS — F411 Generalized anxiety disorder: Secondary | ICD-10-CM

## 2022-03-03 MED ORDER — ESCITALOPRAM OXALATE 20 MG PO TABS: ORAL_TABLET | ORAL | 5 refills | Status: DC

## 2022-03-03 MED ORDER — ALPRAZOLAM 1 MG PO TABS: ORAL_TABLET | ORAL | 2 refills | Status: DC

## 2022-03-03 NOTE — Progress Notes (Addendum)
Sherry Middleton [id number] 03-23-1966 56 y.o.   Virtual Visit via Video Note   I connected with pt @ on 03/03/22 at 09:30 PM EDT by a video enabled telemedicine application and verified that I am speaking with the correct person using two identifiers.   I discussed the limitations of evaluation and management by telemedicine and the availability of in person appointments. The patient expressed understanding and agreed to proceed.   I discussed the assessment and treatment plan with the patient. The patient was provided an opportunity to ask questions and all were answered. The patient agreed with the plan and demonstrated an understanding of the instructions.   The patient was advised to call back or seek an in-person evaluation if the symptoms worsen or if the condition fails to improve as anticipated.   I provided 20 minutes of non-face-to-face time during this encounter.  The patient was located at home.  The provider was located at [place].     Aloha Gell, NP    Subjective:   Patient ID:  Sherry Middleton is a 56 y.o. (DOB [birth year]) female.  Chief Complaint: No chief complaint on file.   HPI Sherry Middleton presents to the office today for follow-up of depression, anxiety, insomnia, obsessional thoughts and actpanic attacks.   Describes mood today as "hanging in there". Pleasant. Tearful at times. Mood symptoms - reports increased depression, anxiety, and irritability - recently had 3 days of increased symptoms, but is feeling better today. Reports panic attacks - 3 to 4 times a week. Having difficulties leaving the house. Stating "I have down days and some better days". Mood is more manageable - "I don't feel like I'm on a roller coaster all the time". Reports chronic pain issues - daily pain. Reports ongoing financial stressors. Working on self care. Working with a Transport planner. Lacking interest and motivation. Taking medications as prescribe.  Energy  levels lower. Active, does not have a regular exercise routine with physical disabilities.  Enjoys some usual interests and activities. Married. Lives with husband and 3 dogs - 2 cats. Spending time with family - 3 sons. Appetite adequate. Suffers from IBS. Working on weight loss. Sleeps better some nights than others. Averages 6 to 7 hours - broken sleep.  Focus and concentration difficulties. Completing tasks. Managing some aspects of household - "doing what I can". Unable to work with current physical and emotional limitations. Receiving disability benefits. Denies SI or HI.  Denies AH or VH. Seeing therapist.      Review of Systems:  Review of Systems  Musculoskeletal:  Negative for gait problem.  Neurological:  Negative for tremors.  Psychiatric/Behavioral:         Please refer to HPI    Medications: I have reviewed the patient's current medications.  Current Outpatient Medications  Medication Sig Dispense Refill   ALPRAZolam (XANAX) 1 MG tablet Take 1 tablet three times daily prn anxiety 90 tablet 2   amiodarone (PACERONE) 200 MG tablet Take 200 mg by mouth daily.     ascorbic acid (VITAMIN C) 500 MG tablet Take by mouth.     colestipol (COLESTID) 1 g tablet Take 1 tablet (1 gram) by mouth once to twice a day as needed 60 tablet 3   dicyclomine (BENTYL) 10 MG capsule Take 1-2 capsules (10-20 mg total) by mouth every 8 (eight) hours as needed for spasms. 90 capsule 1   DUEXIS 800-26.6 MG TABS Take 1 tablet by mouth every 8 (eight) hours as needed (Pain).  ELIQUIS 5 MG TABS tablet Take 5 mg by mouth 2 (two) times daily.     EPINEPHrine 0.3 mg/0.3 mL IJ SOAJ injection SMARTSIG:1 Pre-Filled Pen Syringe IM PRN     escitalopram (LEXAPRO) 20 MG tablet Take one tablet daily. 30 tablet 5   furosemide (LASIX) 20 MG tablet Take 20 mg by mouth daily.      ibuprofen (ADVIL) 800 MG tablet Take 800 mg by mouth 3 (three) times daily as needed.     metoprolol tartrate (LOPRESSOR) 25 MG  tablet Take 25 mg by mouth 2 (two) times daily.     montelukast (SINGULAIR) 10 MG tablet Take 10 mg by mouth at bedtime.     omeprazole (PRILOSEC) 40 MG capsule Take 1 capsule (40 mg total) by mouth 2 (two) times daily. 180 capsule 2   ondansetron (ZOFRAN ODT) 4 MG disintegrating tablet Take 1 tablet (4 mg total) by mouth every 6 (six) hours as needed for nausea or vomiting. 90 tablet 3   polyethylene glycol powder (GLYCOLAX/MIRALAX) 17 GM/SCOOP powder Take 0.5 Containers by mouth daily as needed for mild constipation or moderate constipation. As needed     Probiotic Product (ALIGN) 4 MG CAPS Take 4 mg by mouth daily.     spironolactone (ALDACTONE) 25 MG tablet Take 25 mg by mouth daily.      valsartan-hydrochlorothiazide (DIOVAN-HCT) 160-12.5 MG tablet Take 1 tablet by mouth daily.     Vitamin D, Ergocalciferol, (DRISDOL) 1.25 MG (50000 UT) CAPS capsule Take 50,000 Units by mouth 2 (two) times a week.      Wheat Dextrin (BENEFIBER DRINK MIX PO) Take 1 Package by mouth daily as needed (constipation).     No current facility-administered medications for this visit.    Medication Side Effects: None  Allergies:  Allergies  Allergen Reactions   Levaquin [Levofloxacin In D5w] Shortness Of Breath    avalox and cipro    Levofloxacin Anaphylaxis and Shortness Of Breath    avalox and cipro  avalox and cipro    Prednisone Palpitations    * pt reports she can take tapered doses * pt reports she can take tapered doses * pt reports she can take tapered doses   Other Other (See Comments)    Steroids Hyper sensitive/palpitations   Asa [Aspirin] Other (See Comments)    Ulcers   Ibuprofen Other (See Comments)    Ulcers   Sulfonamide Derivatives Other (See Comments)    Finger nails turn blue    Past Medical History:  Diagnosis Date   Anal fissure    Anemia    Anxiety    Depression    GERD (gastroesophageal reflux disease)    History of weight change    Hypertension    Hypertension     Palpitations    Peptic ulcer disease    Scar    Swelling     Past Medical History, Surgical history, Social history, and Family history were reviewed and updated as appropriate.   Please see review of systems for further details on the patient's review from today.   Objective:   Physical Exam:  There were no vitals taken for this visit.  Physical Exam Constitutional:      General: She is not in acute distress. Musculoskeletal:        General: No deformity.  Neurological:     Mental Status: She is alert and oriented to person, place, and time.     Coordination: Coordination normal.  Psychiatric:  Attention and Perception: Attention and perception normal. She does not perceive auditory or visual hallucinations.        Mood and Affect: Mood normal. Mood is not anxious or depressed. Affect is not labile, blunt, angry or inappropriate.        Speech: Speech normal.        Behavior: Behavior normal.        Thought Content: Thought content normal. Thought content is not paranoid or delusional. Thought content does not include homicidal or suicidal ideation. Thought content does not include homicidal or suicidal plan.        Cognition and Memory: Cognition and memory normal.        Judgment: Judgment normal.     Comments: Insight intact     Lab Review:     Component Value Date/Time   NA 140 10/14/2007 2047   K 4.0 10/14/2007 2047   CL 104 10/14/2007 2047   CO2 25 10/14/2007 2047   GLUCOSE 96 10/14/2007 2047   BUN 9 10/14/2007 2047   CREATININE 0.68 10/14/2007 2047   CALCIUM 8.8 10/14/2007 2047       Component Value Date/Time   WBC 7.3 10/14/2007 2047   RBC 3.98 10/14/2007 2047   HGB 8.3 (L) 10/14/2007 2047   HCT 30.3 (L) 10/14/2007 2047   PLT 380 10/14/2007 2047   MCV 76.1 (L) 10/14/2007 2047   MCHC 27.4 (L) 10/14/2007 2047   RDW 21.9 (H) 10/14/2007 2047   LYMPHSABS 2.0 10/25/2006 1625   MONOABS 0.5 10/25/2006 1625   EOSABS 0.1 10/25/2006 1625   BASOSABS 0.0  10/25/2006 1625    No results found for: "POCLITH", "LITHIUM"   No results found for: "PHENYTOIN", "PHENOBARB", "VALPROATE", "CBMZ"   .res Assessment: Plan:    Plan:  1. Continue Lexapro '20mg'$  daily  2. Continue Xanax '1mg'$  TID  Continue therapy Dena Billet  Patient disabled and unable to work at this time with mental health and physical issues..  Discussed potential benefits, risk, and side effects of benzodiazepines to include potential risk of tolerance and dependence, as well as possible drowsiness.  Advised patient not to drive if experiencing drowsiness and to take lowest possible effective dose to minimize risk of dependence and tolerance.  RTC 4/6 weeks  Diagnoses and all orders for this visit:  Mixed obsessional thoughts and acts  Panic attacks -     ALPRAZolam (XANAX) 1 MG tablet; Take 1 tablet three times daily prn anxiety -     escitalopram (LEXAPRO) 20 MG tablet; Take one tablet daily.  Generalized anxiety disorder -     ALPRAZolam (XANAX) 1 MG tablet; Take 1 tablet three times daily prn anxiety -     escitalopram (LEXAPRO) 20 MG tablet; Take one tablet daily.  Major depressive disorder, recurrent episode, moderate (HCC) -     escitalopram (LEXAPRO) 20 MG tablet; Take one tablet daily.     Please see After Visit Summary for patient specific instructions.  Future Appointments  Date Time Provider [place]  06/05/2022  4:20 PM Etai Copado, Berdie Ogren, NP [facility] None    No orders of the defined types were placed in this encounter.   -------------------------------

## 2022-03-21 DIAGNOSIS — Z9981 Dependence on supplemental oxygen: Secondary | ICD-10-CM | POA: Diagnosis not present

## 2022-03-27 ENCOUNTER — Telehealth: Payer: Self-pay | Admitting: Adult Health

## 2022-03-27 NOTE — Telephone Encounter (Signed)
The cardiology called back and said patient had not taken amiodarone in about 6 months. Called pharmacy and let them know.

## 2022-03-27 NOTE — Telephone Encounter (Signed)
Pharmacy called and is asking if it is okay for her to take Lexapro and amiodarone (prescribed by a different provider).

## 2022-03-27 NOTE — Telephone Encounter (Signed)
Patient has been on both medications for a while. Called Cardiology office to ask if they had concern regarding both medications. Someone will call back.

## 2022-03-27 NOTE — Telephone Encounter (Signed)
The pharmacy lvm stating that they have a question about the lexapro and another medicine that Sherry Middleton has been prescribed by a different doctor. They need to know if it is ok to fill the lexapro. Please give the pharmacy a call at [phone]

## 2022-03-27 NOTE — Telephone Encounter (Signed)
They need to call the PCP who prescribed the medication.

## 2022-04-03 ENCOUNTER — Encounter: Payer: Self-pay | Admitting: Adult Health

## 2022-04-03 ENCOUNTER — Telehealth (INDEPENDENT_AMBULATORY_CARE_PROVIDER_SITE_OTHER): Payer: Medicare Other | Admitting: Adult Health

## 2022-04-03 DIAGNOSIS — F410 Panic disorder [episodic paroxysmal anxiety] without agoraphobia: Secondary | ICD-10-CM | POA: Diagnosis not present

## 2022-04-03 DIAGNOSIS — F411 Generalized anxiety disorder: Secondary | ICD-10-CM | POA: Diagnosis not present

## 2022-04-03 DIAGNOSIS — F422 Mixed obsessional thoughts and acts: Secondary | ICD-10-CM

## 2022-04-03 DIAGNOSIS — F331 Major depressive disorder, recurrent, moderate: Secondary | ICD-10-CM

## 2022-04-03 DIAGNOSIS — G4700 Insomnia, unspecified: Secondary | ICD-10-CM

## 2022-04-03 MED ORDER — BUPROPION HCL ER (XL) 150 MG PO TB24: 150.0000 mg | ORAL_TABLET | Freq: Every day | ORAL | 5 refills | Status: DC
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
## 2022-04-07 DIAGNOSIS — G9332 Myalgic encephalomyelitis/chronic fatigue syndrome: Secondary | ICD-10-CM | POA: Diagnosis not present

## 2022-04-07 DIAGNOSIS — M797 Fibromyalgia: Secondary | ICD-10-CM | POA: Diagnosis not present

## 2022-04-07 DIAGNOSIS — K589 Irritable bowel syndrome without diarrhea: Secondary | ICD-10-CM | POA: Diagnosis not present

## 2022-04-07 DIAGNOSIS — M159 Polyosteoarthritis, unspecified: Secondary | ICD-10-CM | POA: Diagnosis not present

## 2022-04-07 DIAGNOSIS — I4891 Unspecified atrial fibrillation: Secondary | ICD-10-CM | POA: Diagnosis not present

## 2022-04-07 DIAGNOSIS — I739 Peripheral vascular disease, unspecified: Secondary | ICD-10-CM | POA: Diagnosis not present

## 2022-04-07 DIAGNOSIS — E559 Vitamin D deficiency, unspecified: Secondary | ICD-10-CM | POA: Diagnosis not present

## 2022-04-07 DIAGNOSIS — I10 Essential (primary) hypertension: Secondary | ICD-10-CM | POA: Diagnosis not present

## 2022-04-07 DIAGNOSIS — R600 Localized edema: Secondary | ICD-10-CM | POA: Diagnosis not present

## 2022-04-07 DIAGNOSIS — E782 Mixed hyperlipidemia: Secondary | ICD-10-CM | POA: Diagnosis not present

## 2022-04-07 DIAGNOSIS — I872 Venous insufficiency (chronic) (peripheral): Secondary | ICD-10-CM | POA: Diagnosis not present

## 2022-04-18 DIAGNOSIS — Z79899 Other long term (current) drug therapy: Secondary | ICD-10-CM | POA: Diagnosis not present

## 2022-04-18 DIAGNOSIS — E782 Mixed hyperlipidemia: Secondary | ICD-10-CM | POA: Diagnosis not present

## 2022-04-18 DIAGNOSIS — E559 Vitamin D deficiency, unspecified: Secondary | ICD-10-CM | POA: Diagnosis not present

## 2022-04-21 DIAGNOSIS — Z9981 Dependence on supplemental oxygen: Secondary | ICD-10-CM | POA: Diagnosis not present

## 2022-04-26 DIAGNOSIS — R1032 Left lower quadrant pain: Secondary | ICD-10-CM | POA: Diagnosis not present

## 2022-04-26 DIAGNOSIS — K589 Irritable bowel syndrome without diarrhea: Secondary | ICD-10-CM | POA: Diagnosis not present

## 2022-05-02 DIAGNOSIS — K589 Irritable bowel syndrome without diarrhea: Secondary | ICD-10-CM | POA: Diagnosis not present

## 2022-05-02 DIAGNOSIS — R1032 Left lower quadrant pain: Secondary | ICD-10-CM | POA: Diagnosis not present

## 2022-05-02 DIAGNOSIS — Z1231 Encounter for screening mammogram for malignant neoplasm of breast: Secondary | ICD-10-CM | POA: Diagnosis not present

## 2022-05-15 ENCOUNTER — Ambulatory Visit: Payer: Medicare Other

## 2022-05-18 ENCOUNTER — Ambulatory Visit: Payer: Medicare Other | Admitting: Adult Health

## 2022-05-22 DIAGNOSIS — Z9981 Dependence on supplemental oxygen: Secondary | ICD-10-CM | POA: Diagnosis not present

## 2022-06-05 ENCOUNTER — Ambulatory Visit: Payer: Medicare Other | Admitting: Adult Health

## 2022-06-16 ENCOUNTER — Other Ambulatory Visit: Payer: Self-pay | Admitting: Adult Health

## 2022-06-16 DIAGNOSIS — F331 Major depressive disorder, recurrent, moderate: Secondary | ICD-10-CM

## 2022-06-16 NOTE — Telephone Encounter (Signed)
Voice mail is full  

## 2022-06-16 NOTE — Telephone Encounter (Signed)
Please call patient to schedule an appt.-

## 2022-06-21 DIAGNOSIS — Z9981 Dependence on supplemental oxygen: Secondary | ICD-10-CM | POA: Diagnosis not present

## 2022-07-02 NOTE — Progress Notes (Deleted)
[facility]           OF [place]  History and Physical   Sherry Middleton is a 56 y.o. female who presents with ***  She was seen by Dr. Trula Slade in March 2023 for leg swelling.  It had gotten worse over the past 6 months and the left was worse than the right.  She was having soreness and discomfort.  She had tried compression but felt it cut her circulation off at the knee.  She had worn compression wraps that had helped in the past.  She was elevating her legs, which was helping with the swelling.  She was trying to limit her salt intake.    Her venous duplex was relatively unremarkable and was felt she was most likely dealing with lymphedema.  She was managing well and she was being referred to the lymphedema clinic as he felt she was a good candidate for lymphedema pumps.  He was also trying to get her into the weight loss clinic.  She was not ready to entertain surgical weight loss.    The pt does *** have hx of previous venous procedures. The patient has *** history of DVT. Pt does *** history of varicose vein.   Pt does *** history of skin changes in lower legs.   There is *** family history of venous disorders.   The patient has *** used compression stockings in the past.    The pt is not on a statin for cholesterol management.  The pt is not on a daily aspirin.   Other AC:  Eliquis The pt is on BB, diuretic, ARB for hypertension.   The pt is not diabetic.   Tobacco hx:  never  Pt does *** have family hx of AAA.  Past Medical History:  Diagnosis Date   Anal fissure    Anemia    Anxiety    Depression    GERD (gastroesophageal reflux disease)    History of weight change    Hypertension    Hypertension    Palpitations    Peptic ulcer disease    Scar    Swelling     Past Surgical History:  Procedure Laterality Date   BIOPSY  06/10/2019   Procedure: BIOPSY;  Surgeon: Yetta Flock, MD;  Location: [facility];  Service:  Gastroenterology;;   CHOLECYSTECTOMY     club feet surgery     as a child   COLONOSCOPY WITH PROPOFOL N/A 06/10/2019   Procedure: COLONOSCOPY WITH PROPOFOL;  Surgeon: Yetta Flock, MD;  Location: [facility];  Service: Gastroenterology;  Laterality: N/A;   DILATION AND CURETTAGE OF UTERUS     DILATION AND CURETTAGE OF UTERUS     DILATION AND CURETTAGE OF UTERUS     x 2   ESOPHAGOGASTRODUODENOSCOPY (EGD) WITH PROPOFOL N/A 06/10/2019   Procedure: ESOPHAGOGASTRODUODENOSCOPY (EGD) WITH PROPOFOL;  Surgeon: Yetta Flock, MD;  Location: [facility];  Service: Gastroenterology;  Laterality: N/A;   HERNIA REPAIR     KNEE ARTHROSCOPY Left    PARTIAL HYSTERECTOMY     ovaries still intact   POLYPECTOMY  06/10/2019   Procedure: POLYPECTOMY;  Surgeon: Yetta Flock, MD;  Location: [facility];  Service: Gastroenterology;;   TUBAL LIGATION      Social History   Socioeconomic History   Marital status: Married    Spouse name: Not on file   Number of children: Not on file  Years of education: Not on file   Highest education level: Not on file  Occupational History   Not on file  Tobacco Use   Smoking status: Never   Smokeless tobacco: Never  Vaping Use   Vaping Use: Never used  Substance and Sexual Activity   Alcohol use: No   Drug use: No   Sexual activity: Not on file  Other Topics Concern   Not on file  Social History Narrative   Not on file   Social Determinants of Health   Financial Resource Strain: Not on file  Food Insecurity: Not on file  Transportation Needs: Not on file  Physical Activity: Not on file  Stress: Not on file  Social Connections: Not on file  Intimate Partner Violence: Not on file    *** Family History  Problem Relation Age of Onset   Alcoholism Father    Esophageal cancer Father    Heart disease Mother    Kidney disease Mother    Breast cancer Sister        1/2 sister   Diabetes Paternal Grandmother    Heart disease  Paternal Grandmother    Diabetes Paternal Grandfather    Crohn's disease Cousin    Crohn's disease Niece    Colon cancer Neg Hx    Pancreatic cancer Neg Hx    Stomach cancer Neg Hx     Current Outpatient Medications  Medication Sig Dispense Refill   ALPRAZolam (XANAX) 1 MG tablet Take 1 tablet three times daily prn anxiety 90 tablet 2   ascorbic acid (VITAMIN C) 500 MG tablet Take by mouth.     buPROPion (WELLBUTRIN XL) 150 MG 24 hr tablet Take 1 tablet (150 mg total) by mouth daily. 30 tablet 5   colestipol (COLESTID) 1 g tablet Take 1 tablet (1 gram) by mouth once to twice a day as needed 60 tablet 3   dicyclomine (BENTYL) 10 MG capsule Take 1-2 capsules (10-20 mg total) by mouth every 8 (eight) hours as needed for spasms. 90 capsule 1   DUEXIS 800-26.6 MG TABS Take 1 tablet by mouth every 8 (eight) hours as needed (Pain).      ELIQUIS 5 MG TABS tablet Take 5 mg by mouth 2 (two) times daily.     EPINEPHrine 0.3 mg/0.3 mL IJ SOAJ injection SMARTSIG:1 Pre-Filled Pen Syringe IM PRN     escitalopram (LEXAPRO) 20 MG tablet Take one tablet daily. 30 tablet 5   furosemide (LASIX) 20 MG tablet Take 20 mg by mouth daily.      ibuprofen (ADVIL) 800 MG tablet Take 800 mg by mouth 3 (three) times daily as needed.     metoprolol tartrate (LOPRESSOR) 25 MG tablet Take 25 mg by mouth 2 (two) times daily.     montelukast (SINGULAIR) 10 MG tablet Take 10 mg by mouth at bedtime.     omeprazole (PRILOSEC) 40 MG capsule Take 1 capsule (40 mg total) by mouth 2 (two) times daily. 180 capsule 2   ondansetron (ZOFRAN ODT) 4 MG disintegrating tablet Take 1 tablet (4 mg total) by mouth every 6 (six) hours as needed for nausea or vomiting. 90 tablet 3   polyethylene glycol powder (GLYCOLAX/MIRALAX) 17 GM/SCOOP powder Take 0.5 Containers by mouth daily as needed for mild constipation or moderate constipation. As needed     Probiotic Product (ALIGN) 4 MG CAPS Take 4 mg by mouth daily.     spironolactone (ALDACTONE)  25 MG tablet Take 25 mg by mouth  daily.      valsartan-hydrochlorothiazide (DIOVAN-HCT) 160-12.5 MG tablet Take 1 tablet by mouth daily.     Vitamin D, Ergocalciferol, (DRISDOL) 1.25 MG (50000 UT) CAPS capsule Take 50,000 Units by mouth 2 (two) times a week.      Wheat Dextrin (BENEFIBER DRINK MIX PO) Take 1 Package by mouth daily as needed (constipation).     No current facility-administered medications for this visit.    Allergies  Allergen Reactions   Levaquin [Levofloxacin In D5w] Shortness Of Breath    avalox and cipro    Levofloxacin Anaphylaxis and Shortness Of Breath    avalox and cipro  avalox and cipro    Prednisone Palpitations    * pt reports she can take tapered doses * pt reports she can take tapered doses * pt reports she can take tapered doses   Other Other (See Comments)    Steroids Hyper sensitive/palpitations   Asa [Aspirin] Other (See Comments)    Ulcers   Ibuprofen Other (See Comments)    Ulcers   Sulfonamide Derivatives Other (See Comments)    Finger nails turn blue    REVIEW OF SYSTEMS:  *** '[X]'$  denotes positive finding, '[ ]'$  denotes negative finding Cardiac  Comments:  Chest pain or chest pressure:    Shortness of breath upon exertion:    Short of breath when lying flat:    Irregular heart rhythm:        Vascular    Pain in calf, thigh, or hip brought on by ambulation:    Pain in feet at night that wakes you up from your sleep:     Blood clot in your veins:    Leg swelling:  x       Pulmonary    Oxygen at home:    Productive cough:     Wheezing:         Neurologic    Sudden weakness in arms or legs:     Sudden numbness in arms or legs:     Sudden onset of difficulty speaking or slurred speech:    Temporary loss of vision in one eye:     Problems with dizziness:         Gastrointestinal    Blood in stool:     Vomited blood:         Genitourinary    Burning when urinating:     Blood in urine:        Psychiatric    Major  depression:         Hematologic    Bleeding problems:    Problems with blood clotting too easily:        Skin    Rashes or ulcers:        Constitutional    Fever or chills:      PHYSICAL EXAMINATION:  ***  General:  WDWN in NAD; vital signs documented above Gait: Not observed HENT: WNL, normocephalic Pulmonary: normal non-labored breathing without wheezing Cardiac: {Desc; regular/irreg:14544} HR; {With/Without:20273} carotid bruit*** Abdomen: soft, NT, aortic pulse is *** palpable Skin: {With/Without:20273} rashes Vascular Exam/Pulses:  Right Left  Radial {Exam; arterial pulse strength 0-4:30167} {Exam; arterial pulse strength 0-4:30167}  DP {Exam; arterial pulse strength 0-4:30167} {Exam; arterial pulse strength 0-4:30167}  PT {Exam; arterial pulse strength 0-4:30167} {Exam; arterial pulse strength 0-4:30167}   Extremities: ***  Neurologic: A&O X 3;  moving all extremities equally Psychiatric:  The pt has {Desc; normal/abnormal:11317::"Normal"} affect.   Non-Invasive Vascular Imaging:  Venous duplex on ***: ***    Sherry Middleton is a 56 y.o. female who presents with: ***    -pt has *** pedal pulses -pt does *** have evidence of DVT.  Pt does ***have venous reflux *** -discussed with pt about wearing *** high *** mmHg compression stockings and pt was measured for these today.   *** -discussed the importance of leg elevation and how to elevate properly - pt is advised to elevate their legs and a diagram is given to them to demonstrate for pt to lay flat on their back with knees elevated and slightly bent with their feet higher than their knees, which puts their feet higher than their heart for 15 minutes per day.  If pt cannot lay flat, advised to lay as flat as possible.  -pt is advised to continue as much walking as possible and avoid sitting or standing for long periods of time.  -discussed importance of weight loss and exercise and that water aerobics would  also be beneficial.  -handout with recommendations given -pt will f/u ***   Leontine Locket, [facility] [phone]  Clinic MD:  Trula Slade

## 2022-07-03 ENCOUNTER — Ambulatory Visit: Payer: Medicare Other

## 2022-07-15 ENCOUNTER — Other Ambulatory Visit: Payer: Self-pay | Admitting: Adult Health

## 2022-07-15 DIAGNOSIS — F410 Panic disorder [episodic paroxysmal anxiety] without agoraphobia: Secondary | ICD-10-CM

## 2022-07-15 DIAGNOSIS — S8011XA Contusion of right lower leg, initial encounter: Secondary | ICD-10-CM | POA: Diagnosis not present

## 2022-07-15 DIAGNOSIS — F411 Generalized anxiety disorder: Secondary | ICD-10-CM

## 2022-07-15 DIAGNOSIS — S8391XA Sprain of unspecified site of right knee, initial encounter: Secondary | ICD-10-CM | POA: Diagnosis not present

## 2022-07-15 DIAGNOSIS — M25561 Pain in right knee: Secondary | ICD-10-CM | POA: Diagnosis not present

## 2022-07-17 NOTE — Telephone Encounter (Signed)
Filled 9/29

## 2022-07-17 NOTE — Telephone Encounter (Signed)
Please schedule appt

## 2022-07-22 DIAGNOSIS — Z9981 Dependence on supplemental oxygen: Secondary | ICD-10-CM | POA: Diagnosis not present

## 2022-07-31 DIAGNOSIS — M25561 Pain in right knee: Secondary | ICD-10-CM | POA: Diagnosis not present

## 2022-07-31 DIAGNOSIS — R600 Localized edema: Secondary | ICD-10-CM | POA: Diagnosis not present

## 2022-07-31 DIAGNOSIS — M25461 Effusion, right knee: Secondary | ICD-10-CM | POA: Diagnosis not present

## 2022-07-31 DIAGNOSIS — E782 Mixed hyperlipidemia: Secondary | ICD-10-CM | POA: Diagnosis not present

## 2022-07-31 DIAGNOSIS — K589 Irritable bowel syndrome without diarrhea: Secondary | ICD-10-CM | POA: Diagnosis not present

## 2022-07-31 DIAGNOSIS — I10 Essential (primary) hypertension: Secondary | ICD-10-CM | POA: Diagnosis not present

## 2022-08-04 NOTE — Telephone Encounter (Signed)
Appt made Dec 7th

## 2022-08-04 NOTE — Telephone Encounter (Signed)
VM full

## 2022-08-10 ENCOUNTER — Encounter: Payer: Self-pay | Admitting: Adult Health

## 2022-08-10 ENCOUNTER — Telehealth (INDEPENDENT_AMBULATORY_CARE_PROVIDER_SITE_OTHER): Payer: Medicare Other | Admitting: Adult Health

## 2022-08-10 DIAGNOSIS — F411 Generalized anxiety disorder: Secondary | ICD-10-CM | POA: Diagnosis not present

## 2022-08-10 DIAGNOSIS — F410 Panic disorder [episodic paroxysmal anxiety] without agoraphobia: Secondary | ICD-10-CM

## 2022-08-10 DIAGNOSIS — G4700 Insomnia, unspecified: Secondary | ICD-10-CM | POA: Diagnosis not present

## 2022-08-10 DIAGNOSIS — F422 Mixed obsessional thoughts and acts: Secondary | ICD-10-CM

## 2022-08-10 DIAGNOSIS — F331 Major depressive disorder, recurrent, moderate: Secondary | ICD-10-CM

## 2022-08-10 NOTE — Progress Notes (Signed)
Sherry Middleton [id number] 11-Mar-1966 56 y.o.  Subjective:   Patient ID:  Sherry Middleton is a 56 y.o. (DOB 1966-01-02) female.  Chief Complaint: No chief complaint on file.   HPI Sherry Middleton presents to the office today for follow-up of depression, anxiety, insomnia, obsessional thoughts and acts and panic attacks.   Describes mood today as "not the best". Pleasant. Tearful at times. Mood symptoms - reports depression, anxiety and irritability. Reports panic attacks - "I've had a few recently - trying to manage them". Reports worry, rumination, and over thinking. Reports obsessional thoughts and acts. Feels overwhelmed with daily routines. Stating "I can't stand for things to be out of order, but unable to keep things in order". Getting out of the house some - 2 weeks since out last. Reports chronic pain issues - daily pain. Husband with medical issues. Reports ongoing financial stressors. Working with a Transport planner. Lacking interest and motivation. Taking medications as prescribed.  Energy levels lower. Active, does not have a regular exercise routine with physical disabilities.  Enjoys some usual interests and activities. Married. Lives with husband and 3 dogs - 2 cats. Spending time with family - 3 sons. Attends church online. Appetite varies. Suffers from IBS. Working on weight loss. Sleeps better some nights than others. Averages 6 to 8 hours once she gets to sleep. Focus and concentration difficulties. Completing tasks. Managing some aspects of household. Unable to work with current physical and emotional limitations. Receiving disability benefits. Denies SI or HI.  Denies AH or VH. Seeing therapist.   Review of Systems:  Review of Systems  Musculoskeletal:  Negative for gait problem.  Neurological:  Negative for tremors.  Psychiatric/Behavioral:         Please refer to HPI    Medications: I have reviewed the patient's current medications.  Current Outpatient  Medications  Medication Sig Dispense Refill   ALPRAZolam (XANAX) 1 MG tablet TAKE 1 TABLET THREE TIMES DAILY AS NEEDED ANXIETY 90 tablet 0   ascorbic acid (VITAMIN C) 500 MG tablet Take by mouth.     buPROPion (WELLBUTRIN XL) 150 MG 24 hr tablet Take 1 tablet (150 mg total) by mouth daily. 30 tablet 5   colestipol (COLESTID) 1 g tablet Take 1 tablet (1 gram) by mouth once to twice a day as needed 60 tablet 3   dicyclomine (BENTYL) 10 MG capsule Take 1-2 capsules (10-20 mg total) by mouth every 8 (eight) hours as needed for spasms. 90 capsule 1   DUEXIS 800-26.6 MG TABS Take 1 tablet by mouth every 8 (eight) hours as needed (Pain).      ELIQUIS 5 MG TABS tablet Take 5 mg by mouth 2 (two) times daily.     EPINEPHrine 0.3 mg/0.3 mL IJ SOAJ injection SMARTSIG:1 Pre-Filled Pen Syringe IM PRN     escitalopram (LEXAPRO) 20 MG tablet Take one tablet daily. 30 tablet 5   furosemide (LASIX) 20 MG tablet Take 20 mg by mouth daily.      ibuprofen (ADVIL) 800 MG tablet Take 800 mg by mouth 3 (three) times daily as needed.     metoprolol tartrate (LOPRESSOR) 25 MG tablet Take 25 mg by mouth 2 (two) times daily.     montelukast (SINGULAIR) 10 MG tablet Take 10 mg by mouth at bedtime.     omeprazole (PRILOSEC) 40 MG capsule Take 1 capsule (40 mg total) by mouth 2 (two) times daily. 180 capsule 2   ondansetron (ZOFRAN ODT) 4 MG disintegrating tablet Take 1 tablet (4  mg total) by mouth every 6 (six) hours as needed for nausea or vomiting. 90 tablet 3   polyethylene glycol powder (GLYCOLAX/MIRALAX) 17 GM/SCOOP powder Take 0.5 Containers by mouth daily as needed for mild constipation or moderate constipation. As needed     Probiotic Product (ALIGN) 4 MG CAPS Take 4 mg by mouth daily.     spironolactone (ALDACTONE) 25 MG tablet Take 25 mg by mouth daily.      valsartan-hydrochlorothiazide (DIOVAN-HCT) 160-12.5 MG tablet Take 1 tablet by mouth daily.     Vitamin D, Ergocalciferol, (DRISDOL) 1.25 MG (50000 UT) CAPS  capsule Take 50,000 Units by mouth 2 (two) times a week.      Wheat Dextrin (BENEFIBER DRINK MIX PO) Take 1 Package by mouth daily as needed (constipation).     No current facility-administered medications for this visit.    Medication Side Effects: None  Allergies:  Allergies  Allergen Reactions   Levaquin [Levofloxacin In D5w] Shortness Of Breath    avalox and cipro    Levofloxacin Anaphylaxis and Shortness Of Breath    avalox and cipro  avalox and cipro    Prednisone Palpitations    * pt reports she can take tapered doses * pt reports she can take tapered doses * pt reports she can take tapered doses   Other Other (See Comments)    Steroids Hyper sensitive/palpitations   Asa [Aspirin] Other (See Comments)    Ulcers   Ibuprofen Other (See Comments)    Ulcers   Sulfonamide Derivatives Other (See Comments)    Finger nails turn blue    Past Medical History:  Diagnosis Date   Anal fissure    Anemia    Anxiety    Depression    GERD (gastroesophageal reflux disease)    History of weight change    Hypertension    Hypertension    Palpitations    Peptic ulcer disease    Scar    Swelling     Past Medical History, Surgical history, Social history, and Family history were reviewed and updated as appropriate.   Please see review of systems for further details on the patient's review from today.   Objective:   Physical Exam:  There were no vitals taken for this visit.  Physical Exam Constitutional:      General: She is not in acute distress. Musculoskeletal:        General: No deformity.  Neurological:     Mental Status: She is alert and oriented to person, place, and time.     Coordination: Coordination normal.  Psychiatric:        Attention and Perception: Attention and perception normal. She does not perceive auditory or visual hallucinations.        Mood and Affect: Mood normal. Mood is not anxious or depressed. Affect is not labile, blunt, angry or  inappropriate.        Speech: Speech normal.        Behavior: Behavior normal.        Thought Content: Thought content normal. Thought content is not paranoid or delusional. Thought content does not include homicidal or suicidal ideation. Thought content does not include homicidal or suicidal plan.        Cognition and Memory: Cognition and memory normal.        Judgment: Judgment normal.     Comments: Insight intact     Lab Review:     Component Value Date/Time   NA 140 10/14/2007 2047  K 4.0 10/14/2007 2047   CL 104 10/14/2007 2047   CO2 25 10/14/2007 2047   GLUCOSE 96 10/14/2007 2047   BUN 9 10/14/2007 2047   CREATININE 0.68 10/14/2007 2047   CALCIUM 8.8 10/14/2007 2047       Component Value Date/Time   WBC 7.3 10/14/2007 2047   RBC 3.98 10/14/2007 2047   HGB 8.3 (L) 10/14/2007 2047   HCT 30.3 (L) 10/14/2007 2047   PLT 380 10/14/2007 2047   MCV 76.1 (L) 10/14/2007 2047   MCHC 27.4 (L) 10/14/2007 2047   RDW 21.9 (H) 10/14/2007 2047   LYMPHSABS 2.0 10/25/2006 1625   MONOABS 0.5 10/25/2006 1625   EOSABS 0.1 10/25/2006 1625   BASOSABS 0.0 10/25/2006 1625    No results found for: "POCLITH", "LITHIUM"   No results found for: "PHENYTOIN", "PHENOBARB", "VALPROATE", "CBMZ"   .res Assessment: Plan:    Plan:  Lexapro '20mg'$  daily  Xanax '1mg'$  TID  Planning to try Wellbutrin XL '150mg'$  - denies seizure history.  Continue therapy Dena Billet  Patient disabled and unable to work at this time with mental health and physical issues..  Discussed potential benefits, risk, and side effects of benzodiazepines to include potential risk of tolerance and dependence, as well as possible drowsiness. Advised patient not to drive if experiencing drowsiness and to take lowest possible effective dose to minimize risk of dependence and tolerance.  RTC 4/6 weeks  Diagnoses and all orders for this visit:  Major depressive disorder, recurrent episode, moderate (HCC)  Generalized anxiety  disorder  Insomnia, unspecified type  Mixed obsessional thoughts and acts  Panic attacks     Please see After Visit Summary for patient specific instructions.  Future Appointments  Date Time Provider [place]  08/14/2022  1:30 PM [facility]    No orders of the defined types were placed in this encounter.   -------------------------------

## 2022-08-14 ENCOUNTER — Ambulatory Visit: Payer: Medicare Other

## 2022-08-14 DIAGNOSIS — R29810 Facial weakness: Secondary | ICD-10-CM | POA: Diagnosis not present

## 2022-08-14 DIAGNOSIS — I10 Essential (primary) hypertension: Secondary | ICD-10-CM | POA: Diagnosis not present

## 2022-08-14 DIAGNOSIS — Z888 Allergy status to other drugs, medicaments and biological substances status: Secondary | ICD-10-CM | POA: Diagnosis not present

## 2022-08-14 DIAGNOSIS — Z79899 Other long term (current) drug therapy: Secondary | ICD-10-CM | POA: Diagnosis not present

## 2022-08-14 DIAGNOSIS — R29818 Other symptoms and signs involving the nervous system: Secondary | ICD-10-CM | POA: Diagnosis not present

## 2022-08-14 DIAGNOSIS — I6389 Other cerebral infarction: Secondary | ICD-10-CM | POA: Diagnosis not present

## 2022-08-14 DIAGNOSIS — R4781 Slurred speech: Secondary | ICD-10-CM | POA: Diagnosis not present

## 2022-08-14 DIAGNOSIS — Z8711 Personal history of peptic ulcer disease: Secondary | ICD-10-CM | POA: Diagnosis not present

## 2022-08-14 DIAGNOSIS — Z886 Allergy status to analgesic agent status: Secondary | ICD-10-CM | POA: Diagnosis not present

## 2022-08-14 DIAGNOSIS — G510 Bell's palsy: Secondary | ICD-10-CM | POA: Diagnosis not present

## 2022-08-14 DIAGNOSIS — Z881 Allergy status to other antibiotic agents status: Secondary | ICD-10-CM | POA: Diagnosis not present

## 2022-08-15 DIAGNOSIS — I6389 Other cerebral infarction: Secondary | ICD-10-CM

## 2022-08-18 DIAGNOSIS — G510 Bell's palsy: Secondary | ICD-10-CM | POA: Diagnosis not present

## 2022-08-18 DIAGNOSIS — Z09 Encounter for follow-up examination after completed treatment for conditions other than malignant neoplasm: Secondary | ICD-10-CM | POA: Diagnosis not present

## 2022-08-18 DIAGNOSIS — E782 Mixed hyperlipidemia: Secondary | ICD-10-CM | POA: Diagnosis not present

## 2022-08-21 ENCOUNTER — Other Ambulatory Visit: Payer: Self-pay | Admitting: Adult Health

## 2022-08-21 DIAGNOSIS — F410 Panic disorder [episodic paroxysmal anxiety] without agoraphobia: Secondary | ICD-10-CM

## 2022-08-21 DIAGNOSIS — F411 Generalized anxiety disorder: Secondary | ICD-10-CM

## 2022-08-21 DIAGNOSIS — Z9981 Dependence on supplemental oxygen: Secondary | ICD-10-CM | POA: Diagnosis not present

## 2022-08-21 NOTE — Telephone Encounter (Signed)
Filled 11/13

## 2022-08-25 DIAGNOSIS — R051 Acute cough: Secondary | ICD-10-CM | POA: Diagnosis not present

## 2022-08-25 DIAGNOSIS — J069 Acute upper respiratory infection, unspecified: Secondary | ICD-10-CM | POA: Diagnosis not present

## 2022-08-26 DIAGNOSIS — R6889 Other general symptoms and signs: Secondary | ICD-10-CM | POA: Diagnosis not present

## 2022-08-26 DIAGNOSIS — J028 Acute pharyngitis due to other specified organisms: Secondary | ICD-10-CM | POA: Diagnosis not present

## 2022-08-29 DIAGNOSIS — R197 Diarrhea, unspecified: Secondary | ICD-10-CM | POA: Diagnosis not present

## 2022-08-29 DIAGNOSIS — I4891 Unspecified atrial fibrillation: Secondary | ICD-10-CM | POA: Diagnosis not present

## 2022-08-29 DIAGNOSIS — R112 Nausea with vomiting, unspecified: Secondary | ICD-10-CM | POA: Diagnosis not present

## 2022-09-18 ENCOUNTER — Other Ambulatory Visit: Payer: Self-pay | Admitting: Adult Health

## 2022-09-18 DIAGNOSIS — F331 Major depressive disorder, recurrent, moderate: Secondary | ICD-10-CM

## 2022-09-27 ENCOUNTER — Other Ambulatory Visit: Payer: Self-pay | Admitting: Physician Assistant

## 2022-09-27 DIAGNOSIS — F410 Panic disorder [episodic paroxysmal anxiety] without agoraphobia: Secondary | ICD-10-CM

## 2022-09-27 DIAGNOSIS — F411 Generalized anxiety disorder: Secondary | ICD-10-CM

## 2022-10-22 DIAGNOSIS — Z9981 Dependence on supplemental oxygen: Secondary | ICD-10-CM | POA: Diagnosis not present

## 2022-11-16 ENCOUNTER — Telehealth: Payer: Medicare Other | Admitting: Adult Health

## 2022-11-16 DIAGNOSIS — F489 Nonpsychotic mental disorder, unspecified: Secondary | ICD-10-CM

## 2022-11-16 DIAGNOSIS — F410 Panic disorder [episodic paroxysmal anxiety] without agoraphobia: Secondary | ICD-10-CM

## 2022-11-16 DIAGNOSIS — F331 Major depressive disorder, recurrent, moderate: Secondary | ICD-10-CM

## 2022-11-16 DIAGNOSIS — F411 Generalized anxiety disorder: Secondary | ICD-10-CM

## 2022-11-16 MED ORDER — BUPROPION HCL ER (XL) 150 MG PO TB24: 150.0000 mg | ORAL_TABLET | Freq: Every day | ORAL | 5 refills | Status: DC

## 2022-11-16 MED ORDER — ESCITALOPRAM OXALATE 20 MG PO TABS: ORAL_TABLET | ORAL | 5 refills | Status: DC

## 2022-11-16 MED ORDER — ALPRAZOLAM 1 MG PO TABS: ORAL_TABLET | ORAL | 2 refills | Status: DC

## 2022-11-16 NOTE — Progress Notes (Signed)
No charge for appt.

## 2022-11-20 DIAGNOSIS — Z9981 Dependence on supplemental oxygen: Secondary | ICD-10-CM | POA: Diagnosis not present

## 2022-12-21 DIAGNOSIS — Z9981 Dependence on supplemental oxygen: Secondary | ICD-10-CM | POA: Diagnosis not present

## 2022-12-25 DIAGNOSIS — K589 Irritable bowel syndrome without diarrhea: Secondary | ICD-10-CM | POA: Diagnosis not present

## 2022-12-25 DIAGNOSIS — I10 Essential (primary) hypertension: Secondary | ICD-10-CM | POA: Diagnosis not present

## 2022-12-25 DIAGNOSIS — E785 Hyperlipidemia, unspecified: Secondary | ICD-10-CM | POA: Diagnosis not present

## 2022-12-25 DIAGNOSIS — I4891 Unspecified atrial fibrillation: Secondary | ICD-10-CM | POA: Diagnosis not present

## 2023-01-20 DIAGNOSIS — Z9981 Dependence on supplemental oxygen: Secondary | ICD-10-CM | POA: Diagnosis not present

## 2023-02-06 DIAGNOSIS — I499 Cardiac arrhythmia, unspecified: Secondary | ICD-10-CM | POA: Diagnosis not present

## 2023-02-06 DIAGNOSIS — Z7901 Long term (current) use of anticoagulants: Secondary | ICD-10-CM | POA: Diagnosis not present

## 2023-02-06 DIAGNOSIS — I10 Essential (primary) hypertension: Secondary | ICD-10-CM | POA: Diagnosis not present

## 2023-02-06 DIAGNOSIS — R9431 Abnormal electrocardiogram [ECG] [EKG]: Secondary | ICD-10-CM | POA: Diagnosis not present

## 2023-02-06 DIAGNOSIS — E7800 Pure hypercholesterolemia, unspecified: Secondary | ICD-10-CM | POA: Diagnosis not present

## 2023-02-06 DIAGNOSIS — I4891 Unspecified atrial fibrillation: Secondary | ICD-10-CM | POA: Diagnosis not present

## 2023-02-13 ENCOUNTER — Other Ambulatory Visit: Payer: Self-pay | Admitting: Adult Health

## 2023-02-13 DIAGNOSIS — F331 Major depressive disorder, recurrent, moderate: Secondary | ICD-10-CM

## 2023-02-13 DIAGNOSIS — R0683 Snoring: Secondary | ICD-10-CM | POA: Diagnosis not present

## 2023-02-20 DIAGNOSIS — Z9981 Dependence on supplemental oxygen: Secondary | ICD-10-CM | POA: Diagnosis not present

## 2023-03-13 ENCOUNTER — Other Ambulatory Visit: Payer: Self-pay | Admitting: Adult Health

## 2023-03-13 DIAGNOSIS — F411 Generalized anxiety disorder: Secondary | ICD-10-CM

## 2023-03-13 DIAGNOSIS — F410 Panic disorder [episodic paroxysmal anxiety] without agoraphobia: Secondary | ICD-10-CM

## 2023-03-14 DIAGNOSIS — E7800 Pure hypercholesterolemia, unspecified: Secondary | ICD-10-CM | POA: Diagnosis not present

## 2023-03-14 DIAGNOSIS — I4891 Unspecified atrial fibrillation: Secondary | ICD-10-CM | POA: Diagnosis not present

## 2023-03-14 DIAGNOSIS — I10 Essential (primary) hypertension: Secondary | ICD-10-CM | POA: Diagnosis not present

## 2023-03-14 DIAGNOSIS — R9431 Abnormal electrocardiogram [ECG] [EKG]: Secondary | ICD-10-CM | POA: Diagnosis not present

## 2023-03-16 ENCOUNTER — Other Ambulatory Visit: Payer: Self-pay

## 2023-03-16 ENCOUNTER — Telehealth: Payer: Self-pay | Admitting: Adult Health

## 2023-03-16 DIAGNOSIS — F410 Panic disorder [episodic paroxysmal anxiety] without agoraphobia: Secondary | ICD-10-CM

## 2023-03-16 DIAGNOSIS — F411 Generalized anxiety disorder: Secondary | ICD-10-CM

## 2023-03-16 MED ORDER — ALPRAZOLAM 1 MG PO TABS
ORAL_TABLET | ORAL | 0 refills | Status: DC
Start: 2023-03-16 — End: 2023-03-19

## 2023-03-16 NOTE — Telephone Encounter (Signed)
Pended enough to appt.  

## 2023-03-16 NOTE — Telephone Encounter (Signed)
Pt is scheduled for 7/15.

## 2023-03-16 NOTE — Telephone Encounter (Signed)
Pt called at 9:02a requesting refill of Alprazolam to   [id number] - [place],  - [address] [place] [address] Phone: [phone]  Fax: [phone]    No upcoming appt scheduled.

## 2023-03-16 NOTE — Telephone Encounter (Signed)
Please call to schedule appt, Last seen in December, was due for FU in March.

## 2023-03-19 ENCOUNTER — Other Ambulatory Visit: Payer: Self-pay | Admitting: Adult Health

## 2023-03-19 ENCOUNTER — Encounter: Payer: Self-pay | Admitting: Adult Health

## 2023-03-19 ENCOUNTER — Telehealth (INDEPENDENT_AMBULATORY_CARE_PROVIDER_SITE_OTHER): Payer: Medicare Other | Admitting: Adult Health

## 2023-03-19 DIAGNOSIS — F411 Generalized anxiety disorder: Secondary | ICD-10-CM | POA: Diagnosis not present

## 2023-03-19 DIAGNOSIS — G4700 Insomnia, unspecified: Secondary | ICD-10-CM | POA: Diagnosis not present

## 2023-03-19 DIAGNOSIS — F331 Major depressive disorder, recurrent, moderate: Secondary | ICD-10-CM

## 2023-03-19 DIAGNOSIS — F410 Panic disorder [episodic paroxysmal anxiety] without agoraphobia: Secondary | ICD-10-CM

## 2023-03-19 DIAGNOSIS — F422 Mixed obsessional thoughts and acts: Secondary | ICD-10-CM

## 2023-03-19 MED ORDER — ESCITALOPRAM OXALATE 20 MG PO TABS
ORAL_TABLET | ORAL | 5 refills | Status: DC
Start: 2023-03-19 — End: 2023-10-17

## 2023-03-19 MED ORDER — ALPRAZOLAM 1 MG PO TABS
ORAL_TABLET | ORAL | 2 refills | Status: DC
Start: 2023-03-19 — End: 2023-06-19

## 2023-03-19 NOTE — Progress Notes (Signed)
Sherry Middleton [id number] June 21, 1966 57 y.o.  Virtual Visit via Video Note  I connected with pt @ on 03/19/23 at  8:00 AM EDT by a video enabled telemedicine application and verified that I am speaking with the correct person using two identifiers.   I discussed the limitations of evaluation and management by telemedicine and the availability of in person appointments. The patient expressed understanding and agreed to proceed.  I discussed the assessment and treatment plan with the patient. The patient was provided an opportunity to ask questions and all were answered. The patient agreed with the plan and demonstrated an understanding of the instructions.   The patient was advised to call back or seek an in-person evaluation if the symptoms worsen or if the condition fails to improve as anticipated.  I provided 25 minutes of non-face-to-face time during this encounter.  The patient was located at home.  The provider was located at [facility].   Dorothyann Gibbs, NP   Subjective:   Patient ID:  Sherry Middleton is a 57 y.o. (DOB 1965/11/16) female.  Chief Complaint: No chief complaint on file.   HPI Sherry Middleton presents for follow-up of  depression, anxiety, insomnia, obsessional thoughts and acts and panic attacks.   Describes mood today as "not too good". Pleasant. Tearful at times. Mood symptoms - reports depression, anxiety and irritability. Reports panic attacks. Reports worry, rumination, and over thinking. Reports obsessional thoughts and acts. Reports getting easily overwhelmed with daily tasks. Mood is variable. Mostly staying home - does not feel comfortable being around other people. Feels overwhelmed with daily routines - trying to do more when she can - physical limitations. Working on getting up and dressed every day. Reports chronic pain issues - daily. Stating "I feel like some days are better than others". Husband with medical issues. Reports  ongoing financial stressors. Currently without transportation. Working with a Paramedic. Lacking interest and motivation. Taking medications as prescribed.  Energy levels lower. Active, does not have a regular exercise routine with physical disabilities. Reports falling 5 times this year. Enjoys some usual interests and activities. Married. Lives with husband and 3 dogs - 2 cats. Spending time with family - 3 sons. Attends church online. Appetite varies. Suffers from IBS. Working on weight loss. Sleeps better some nights than others. Reports broken sleep. Averages 6 hours.Waking up in pain throughout the night. Reports focus and concentration difficulties. Completing tasks. Managing minimal aspects of household. Unable to work with current physical and emotional limitations. Receiving disability benefits. Denies SI or HI.  Denies AH or VH. Denies self harm. Denies substance use. Seeing therapist twice a month.  Review of Systems:  Review of Systems  Musculoskeletal:  Negative for gait problem.  Neurological:  Negative for tremors.  Psychiatric/Behavioral:         Please refer to HPI    Medications: I have reviewed the patient's current medications.  Current Outpatient Medications  Medication Sig Dispense Refill   ALPRAZolam (XANAX) 1 MG tablet TAKE 1 TABLET THREE TIMES DAILY AS NEEDED ANXIETY 90 tablet 2   ascorbic acid (VITAMIN C) 500 MG tablet Take by mouth.     buPROPion (WELLBUTRIN XL) 150 MG 24 hr tablet Take 1 tablet (150 mg total) by mouth daily. 30 tablet 5   colestipol (COLESTID) 1 g tablet Take 1 tablet (1 gram) by mouth once to twice a day as needed 60 tablet 3   dicyclomine (BENTYL) 10 MG capsule Take 1-2 capsules (10-20 mg total) by mouth  every 8 (eight) hours as needed for spasms. 90 capsule 1   DUEXIS 800-26.6 MG TABS Take 1 tablet by mouth every 8 (eight) hours as needed (Pain).      ELIQUIS 5 MG TABS tablet Take 5 mg by mouth 2 (two) times daily.     EPINEPHrine 0.3  mg/0.3 mL IJ SOAJ injection SMARTSIG:1 Pre-Filled Pen Syringe IM PRN     escitalopram (LEXAPRO) 20 MG tablet Take one tablet daily. 30 tablet 5   furosemide (LASIX) 20 MG tablet Take 20 mg by mouth daily.      ibuprofen (ADVIL) 800 MG tablet Take 800 mg by mouth 3 (three) times daily as needed.     metoprolol tartrate (LOPRESSOR) 25 MG tablet Take 25 mg by mouth 2 (two) times daily.     montelukast (SINGULAIR) 10 MG tablet Take 10 mg by mouth at bedtime.     omeprazole (PRILOSEC) 40 MG capsule Take 1 capsule (40 mg total) by mouth 2 (two) times daily. 180 capsule 2   ondansetron (ZOFRAN ODT) 4 MG disintegrating tablet Take 1 tablet (4 mg total) by mouth every 6 (six) hours as needed for nausea or vomiting. 90 tablet 3   polyethylene glycol powder (GLYCOLAX/MIRALAX) 17 GM/SCOOP powder Take 0.5 Containers by mouth daily as needed for mild constipation or moderate constipation. As needed     Probiotic Product (ALIGN) 4 MG CAPS Take 4 mg by mouth daily.     spironolactone (ALDACTONE) 25 MG tablet Take 25 mg by mouth daily.      valsartan-hydrochlorothiazide (DIOVAN-HCT) 160-12.5 MG tablet Take 1 tablet by mouth daily.     Vitamin D, Ergocalciferol, (DRISDOL) 1.25 MG (50000 UT) CAPS capsule Take 50,000 Units by mouth 2 (two) times a week.      Wheat Dextrin (BENEFIBER DRINK MIX PO) Take 1 Package by mouth daily as needed (constipation).     No current facility-administered medications for this visit.    Medication Side Effects: None  Allergies:  Allergies  Allergen Reactions   Levaquin [Levofloxacin In D5w] Shortness Of Breath    avalox and cipro    Levofloxacin Anaphylaxis and Shortness Of Breath    avalox and cipro  avalox and cipro    Prednisone Palpitations    * pt reports she can take tapered doses * pt reports she can take tapered doses * pt reports she can take tapered doses   Other Other (See Comments)    Steroids Hyper sensitive/palpitations   Asa [Aspirin] Other (See Comments)     Ulcers   Ibuprofen Other (See Comments)    Ulcers   Sulfonamide Derivatives Other (See Comments)    Finger nails turn blue    Past Medical History:  Diagnosis Date   Anal fissure    Anemia    Anxiety    Depression    GERD (gastroesophageal reflux disease)    History of weight change    Hypertension    Hypertension    Palpitations    Peptic ulcer disease    Scar    Swelling     Family History  Problem Relation Age of Onset   Alcoholism Father    Esophageal cancer Father    Heart disease Mother    Kidney disease Mother    Breast cancer Sister        1/2 sister   Diabetes Paternal Grandmother    Heart disease Paternal Grandmother    Diabetes Paternal Grandfather    Crohn's disease Cousin  Crohn's disease Niece    Colon cancer Neg Hx    Pancreatic cancer Neg Hx    Stomach cancer Neg Hx     Social History   Socioeconomic History   Marital status: Married    Spouse name: Not on file   Number of children: Not on file   Years of education: Not on file   Highest education level: Not on file  Occupational History   Not on file  Tobacco Use   Smoking status: Never   Smokeless tobacco: Never  Vaping Use   Vaping status: Never Used  Substance and Sexual Activity   Alcohol use: No   Drug use: No   Sexual activity: Not on file  Other Topics Concern   Not on file  Social History Narrative   Not on file   Social Determinants of Health   Financial Resource Strain: Not on file  Food Insecurity: Not on file  Transportation Needs: Not on file  Physical Activity: Not on file  Stress: Not on file  Social Connections: Not on file  Intimate Partner Violence: Not on file    Past Medical History, Surgical history, Social history, and Family history were reviewed and updated as appropriate.   Please see review of systems for further details on the patient's review from today.   Objective:   Physical Exam:  There were no vitals taken for this  visit.  Physical Exam Constitutional:      General: She is not in acute distress. Musculoskeletal:        General: No deformity.  Neurological:     Mental Status: She is alert and oriented to person, place, and time.     Coordination: Coordination normal.  Psychiatric:        Attention and Perception: Attention and perception normal. She does not perceive auditory or visual hallucinations.        Mood and Affect: Affect is not labile, blunt, angry or inappropriate.        Speech: Speech normal.        Behavior: Behavior normal.        Thought Content: Thought content normal. Thought content is not paranoid or delusional. Thought content does not include homicidal or suicidal ideation. Thought content does not include homicidal or suicidal plan.        Cognition and Memory: Cognition and memory normal.        Judgment: Judgment normal.     Comments: Insight intact     Lab Review:     Component Value Date/Time   NA 140 10/14/2007 2047   K 4.0 10/14/2007 2047   CL 104 10/14/2007 2047   CO2 25 10/14/2007 2047   GLUCOSE 96 10/14/2007 2047   BUN 9 10/14/2007 2047   CREATININE 0.68 10/14/2007 2047   CALCIUM 8.8 10/14/2007 2047       Component Value Date/Time   WBC 7.3 10/14/2007 2047   RBC 3.98 10/14/2007 2047   HGB 8.3 (L) 10/14/2007 2047   HCT 30.3 (L) 10/14/2007 2047   PLT 380 10/14/2007 2047   MCV 76.1 (L) 10/14/2007 2047   MCHC 27.4 (L) 10/14/2007 2047   RDW 21.9 (H) 10/14/2007 2047   LYMPHSABS 2.0 10/25/2006 1625   MONOABS 0.5 10/25/2006 1625   EOSABS 0.1 10/25/2006 1625   BASOSABS 0.0 10/25/2006 1625    No results found for: "POCLITH", "LITHIUM"   No results found for: "PHENYTOIN", "PHENOBARB", "VALPROATE", "CBMZ"   .res Assessment: Plan:    Plan:  Lexapro 20mg  daily - taking 10mg  daily Xanax 1mg  TID - mostly taking twice a day  Planning to try Wellbutrin XL 150mg  - denies seizure history.  Therapist Merwyn Katos  RTC 3 months  Patient disabled and  unable to work at this time with mental health and physical disabilities.  Discussed potential benefits, risk, and side effects of benzodiazepines to include potential risk of tolerance and dependence, as well as possible drowsiness. Advised patient not to drive if experiencing drowsiness and to take lowest possible effective dose to minimize risk of dependence and tolerance.  Diagnoses and all orders for this visit:  Major depressive disorder, recurrent episode, moderate (HCC) -     escitalopram (LEXAPRO) 20 MG tablet; Take one tablet daily.  Generalized anxiety disorder -     ALPRAZolam (XANAX) 1 MG tablet; TAKE 1 TABLET THREE TIMES DAILY AS NEEDED ANXIETY -     escitalopram (LEXAPRO) 20 MG tablet; Take one tablet daily.  Panic attacks -     ALPRAZolam (XANAX) 1 MG tablet; TAKE 1 TABLET THREE TIMES DAILY AS NEEDED ANXIETY -     escitalopram (LEXAPRO) 20 MG tablet; Take one tablet daily.  Insomnia, unspecified type  Mixed obsessional thoughts and acts     Please see After Visit Summary for patient specific instructions.  No future appointments.  No orders of the defined types were placed in this encounter.     -------------------------------

## 2023-03-22 DIAGNOSIS — M47812 Spondylosis without myelopathy or radiculopathy, cervical region: Secondary | ICD-10-CM | POA: Diagnosis not present

## 2023-03-22 DIAGNOSIS — Z9981 Dependence on supplemental oxygen: Secondary | ICD-10-CM | POA: Diagnosis not present

## 2023-03-29 DIAGNOSIS — I4891 Unspecified atrial fibrillation: Secondary | ICD-10-CM | POA: Diagnosis not present

## 2023-03-29 DIAGNOSIS — I10 Essential (primary) hypertension: Secondary | ICD-10-CM | POA: Diagnosis not present

## 2023-03-29 DIAGNOSIS — I4719 Other supraventricular tachycardia: Secondary | ICD-10-CM | POA: Diagnosis not present

## 2023-04-22 DIAGNOSIS — Z9981 Dependence on supplemental oxygen: Secondary | ICD-10-CM | POA: Diagnosis not present

## 2023-05-10 DIAGNOSIS — E538 Deficiency of other specified B group vitamins: Secondary | ICD-10-CM | POA: Diagnosis not present

## 2023-05-10 DIAGNOSIS — Z0000 Encounter for general adult medical examination without abnormal findings: Secondary | ICD-10-CM | POA: Diagnosis not present

## 2023-05-10 DIAGNOSIS — Z79899 Other long term (current) drug therapy: Secondary | ICD-10-CM | POA: Diagnosis not present

## 2023-05-17 ENCOUNTER — Other Ambulatory Visit (HOSPITAL_BASED_OUTPATIENT_CLINIC_OR_DEPARTMENT_OTHER): Payer: Self-pay

## 2023-05-17 DIAGNOSIS — R0683 Snoring: Secondary | ICD-10-CM

## 2023-05-18 DIAGNOSIS — M1712 Unilateral primary osteoarthritis, left knee: Secondary | ICD-10-CM | POA: Diagnosis not present

## 2023-05-18 DIAGNOSIS — W19XXXA Unspecified fall, initial encounter: Secondary | ICD-10-CM | POA: Diagnosis not present

## 2023-05-18 DIAGNOSIS — M25562 Pain in left knee: Secondary | ICD-10-CM | POA: Diagnosis not present

## 2023-05-21 ENCOUNTER — Ambulatory Visit: Payer: Medicare Other | Admitting: Adult Health

## 2023-05-23 DIAGNOSIS — Z9981 Dependence on supplemental oxygen: Secondary | ICD-10-CM | POA: Diagnosis not present

## 2023-06-04 DIAGNOSIS — D2361 Other benign neoplasm of skin of right upper limb, including shoulder: Secondary | ICD-10-CM | POA: Diagnosis not present

## 2023-06-04 DIAGNOSIS — D485 Neoplasm of uncertain behavior of skin: Secondary | ICD-10-CM | POA: Diagnosis not present

## 2023-06-04 DIAGNOSIS — D2262 Melanocytic nevi of left upper limb, including shoulder: Secondary | ICD-10-CM | POA: Diagnosis not present

## 2023-06-04 DIAGNOSIS — D2261 Melanocytic nevi of right upper limb, including shoulder: Secondary | ICD-10-CM | POA: Diagnosis not present

## 2023-06-13 ENCOUNTER — Ambulatory Visit (HOSPITAL_BASED_OUTPATIENT_CLINIC_OR_DEPARTMENT_OTHER): Payer: Medicare Other | Attending: Internal Medicine | Admitting: Internal Medicine

## 2023-06-19 ENCOUNTER — Other Ambulatory Visit: Payer: Self-pay | Admitting: Adult Health

## 2023-06-19 ENCOUNTER — Telehealth: Payer: Medicare Other | Admitting: Adult Health

## 2023-06-19 ENCOUNTER — Encounter: Payer: Self-pay | Admitting: Adult Health

## 2023-06-19 DIAGNOSIS — F419 Anxiety disorder, unspecified: Secondary | ICD-10-CM | POA: Diagnosis not present

## 2023-06-19 DIAGNOSIS — G4700 Insomnia, unspecified: Secondary | ICD-10-CM | POA: Diagnosis not present

## 2023-06-19 DIAGNOSIS — F331 Major depressive disorder, recurrent, moderate: Secondary | ICD-10-CM

## 2023-06-19 DIAGNOSIS — F410 Panic disorder [episodic paroxysmal anxiety] without agoraphobia: Secondary | ICD-10-CM | POA: Diagnosis not present

## 2023-06-19 DIAGNOSIS — F411 Generalized anxiety disorder: Secondary | ICD-10-CM

## 2023-06-19 DIAGNOSIS — F32A Depression, unspecified: Secondary | ICD-10-CM | POA: Diagnosis not present

## 2023-06-19 MED ORDER — ALPRAZOLAM 1 MG PO TABS
ORAL_TABLET | ORAL | 2 refills | Status: DC
Start: 2023-06-19 — End: 2023-07-09

## 2023-06-19 NOTE — Progress Notes (Signed)
Sherry Middleton [id number] August 31, 1966 57 y.o.  Virtual Visit via Video Note  I connected with pt @ on 06/19/23 at 11:20 AM EDT by a video enabled telemedicine application and verified that I am speaking with the correct person using two identifiers.   I discussed the limitations of evaluation and management by telemedicine and the availability of in person appointments. The patient expressed understanding and agreed to proceed.  I discussed the assessment and treatment plan with the patient. The patient was provided an opportunity to ask questions and all were answered. The patient agreed with the plan and demonstrated an understanding of the instructions.   The patient was advised to call back or seek an in-person evaluation if the symptoms worsen or if the condition fails to improve as anticipated.  I provided 25 minutes of non-face-to-face time during this encounter.  The patient was located at home.  The provider was located at [facility].   Dorothyann Gibbs, NP   Subjective:   Patient ID:  Sherry Middleton is a 57 y.o. (DOB 1966/04/10) female.  Chief Complaint: No chief complaint on file.   HPI Vanya Carberry presents for follow-up of depression, anxiety, insomnia, obsessional thoughts and acts and panic attacks.   Describes mood today as "ok". Pleasant. Tearful at times. Mood symptoms - reports depression, anxiety and irritability. Reports panic attacks. Reports worry, rumination, and over thinking. Reports obsessional thoughts and acts. Reports feeling overwhelmed at times - working more with coping mechanisms. Mood is variable. Reports she was tapered off the Lexapro over the past several months and feels she is doing "ok for now". Mostly staying home, but getting out more. Reports improved self care getting up and dressed every day. Stating "I'm making myself do more". Reports chronic pain issues - daily. Husband with medical issues. Reports ongoing financial  stressors. Currently without transportation. Working with a Paramedic. Lacking interest and motivation. Taking medications as prescribed.  Energy levels variable. Active, does not have a regular exercise routine with physical disabilities. Reports falling 5 times this year. Enjoys some usual interests and activities. Married. Lives with husband and pets. Spending time with family - 3 sons. Attends church online. Appetite varies. Suffers from IBS. Working on weight loss. Sleeps better some nights than others. Averages 6.5 to 7 hours.  Reports focus and concentration difficulties at times. Completing tasks. Managing minimal aspects of household. Unable to work with current physical and emotional limitations. Receiving disability benefits. Denies SI or HI.  Denies AH or VH. Denies self harm. Denies substance use. Seeing therapist twice a month.   Review of Systems:  Review of Systems  Musculoskeletal:  Negative for gait problem.  Neurological:  Negative for tremors.  Psychiatric/Behavioral:         Please refer to HPI    Medications: I have reviewed the patient's current medications.  Current Outpatient Medications  Medication Sig Dispense Refill   ALPRAZolam (XANAX) 1 MG tablet TAKE 1 TABLET THREE TIMES DAILY AS NEEDED ANXIETY 90 tablet 2   ascorbic acid (VITAMIN C) 500 MG tablet Take by mouth.     buPROPion (WELLBUTRIN XL) 150 MG 24 hr tablet TAKE 1 TABLET BY MOUTH EVERY DAY 90 tablet 0   colestipol (COLESTID) 1 g tablet Take 1 tablet (1 gram) by mouth once to twice a day as needed 60 tablet 3   dicyclomine (BENTYL) 10 MG capsule Take 1-2 capsules (10-20 mg total) by mouth every 8 (eight) hours as needed for spasms. 90 capsule 1  DUEXIS 800-26.6 MG TABS Take 1 tablet by mouth every 8 (eight) hours as needed (Pain).      ELIQUIS 5 MG TABS tablet Take 5 mg by mouth 2 (two) times daily.     EPINEPHrine 0.3 mg/0.3 mL IJ SOAJ injection SMARTSIG:1 Pre-Filled Pen Syringe IM PRN      escitalopram (LEXAPRO) 20 MG tablet Take one tablet daily. 30 tablet 5   furosemide (LASIX) 20 MG tablet Take 20 mg by mouth daily.      ibuprofen (ADVIL) 800 MG tablet Take 800 mg by mouth 3 (three) times daily as needed.     metoprolol tartrate (LOPRESSOR) 25 MG tablet Take 25 mg by mouth 2 (two) times daily.     montelukast (SINGULAIR) 10 MG tablet Take 10 mg by mouth at bedtime.     omeprazole (PRILOSEC) 40 MG capsule Take 1 capsule (40 mg total) by mouth 2 (two) times daily. 180 capsule 2   ondansetron (ZOFRAN ODT) 4 MG disintegrating tablet Take 1 tablet (4 mg total) by mouth every 6 (six) hours as needed for nausea or vomiting. 90 tablet 3   polyethylene glycol powder (GLYCOLAX/MIRALAX) 17 GM/SCOOP powder Take 0.5 Containers by mouth daily as needed for mild constipation or moderate constipation. As needed     Probiotic Product (ALIGN) 4 MG CAPS Take 4 mg by mouth daily.     spironolactone (ALDACTONE) 25 MG tablet Take 25 mg by mouth daily.      valsartan-hydrochlorothiazide (DIOVAN-HCT) 160-12.5 MG tablet Take 1 tablet by mouth daily.     Vitamin D, Ergocalciferol, (DRISDOL) 1.25 MG (50000 UT) CAPS capsule Take 50,000 Units by mouth 2 (two) times a week.      Wheat Dextrin (BENEFIBER DRINK MIX PO) Take 1 Package by mouth daily as needed (constipation).     No current facility-administered medications for this visit.    Medication Side Effects: None  Allergies:  Allergies  Allergen Reactions   Levaquin [Levofloxacin In D5w] Shortness Of Breath    avalox and cipro    Levofloxacin Anaphylaxis and Shortness Of Breath    avalox and cipro  avalox and cipro    Prednisone Palpitations    * pt reports she can take tapered doses * pt reports she can take tapered doses * pt reports she can take tapered doses   Other Other (See Comments)    Steroids Hyper sensitive/palpitations   Asa [Aspirin] Other (See Comments)    Ulcers   Ibuprofen Other (See Comments)    Ulcers   Sulfonamide  Derivatives Other (See Comments)    Finger nails turn blue    Past Medical History:  Diagnosis Date   Anal fissure    Anemia    Anxiety    Depression    GERD (gastroesophageal reflux disease)    History of weight change    Hypertension    Hypertension    Palpitations    Peptic ulcer disease    Scar    Swelling     Family History  Problem Relation Age of Onset   Alcoholism Father    Esophageal cancer Father    Heart disease Mother    Kidney disease Mother    Breast cancer Sister        1/2 sister   Diabetes Paternal Grandmother    Heart disease Paternal Grandmother    Diabetes Paternal Grandfather    Crohn's disease Cousin    Crohn's disease Niece    Colon cancer Neg Hx  Pancreatic cancer Neg Hx    Stomach cancer Neg Hx     Social History   Socioeconomic History   Marital status: Married    Spouse name: Not on file   Number of children: Not on file   Years of education: Not on file   Highest education level: Not on file  Occupational History   Not on file  Tobacco Use   Smoking status: Never   Smokeless tobacco: Never  Vaping Use   Vaping status: Never Used  Substance and Sexual Activity   Alcohol use: No   Drug use: No   Sexual activity: Not on file  Other Topics Concern   Not on file  Social History Narrative   Not on file   Social Determinants of Health   Financial Resource Strain: Not on file  Food Insecurity: Not on file  Transportation Needs: Not on file  Physical Activity: Not on file  Stress: Not on file  Social Connections: Not on file  Intimate Partner Violence: Not on file    Past Medical History, Surgical history, Social history, and Family history were reviewed and updated as appropriate.   Please see review of systems for further details on the patient's review from today.   Objective:   Physical Exam:  There were no vitals taken for this visit.  Physical Exam Constitutional:      General: She is not in acute  distress. Musculoskeletal:        General: No deformity.  Neurological:     Mental Status: She is alert and oriented to person, place, and time.     Coordination: Coordination normal.  Psychiatric:        Attention and Perception: Attention and perception normal. She does not perceive auditory or visual hallucinations.        Mood and Affect: Mood normal. Mood is not anxious or depressed. Affect is not labile, blunt, angry or inappropriate.        Speech: Speech normal.        Behavior: Behavior normal.        Thought Content: Thought content normal. Thought content is not paranoid or delusional. Thought content does not include homicidal or suicidal ideation. Thought content does not include homicidal or suicidal plan.        Cognition and Memory: Cognition and memory normal.        Judgment: Judgment normal.     Comments: Insight intact     Lab Review:     Component Value Date/Time   NA 140 10/14/2007 2047   K 4.0 10/14/2007 2047   CL 104 10/14/2007 2047   CO2 25 10/14/2007 2047   GLUCOSE 96 10/14/2007 2047   BUN 9 10/14/2007 2047   CREATININE 0.68 10/14/2007 2047   CALCIUM 8.8 10/14/2007 2047       Component Value Date/Time   WBC 7.3 10/14/2007 2047   RBC 3.98 10/14/2007 2047   HGB 8.3 (L) 10/14/2007 2047   HCT 30.3 (L) 10/14/2007 2047   PLT 380 10/14/2007 2047   MCV 76.1 (L) 10/14/2007 2047   MCHC 27.4 (L) 10/14/2007 2047   RDW 21.9 (H) 10/14/2007 2047   LYMPHSABS 2.0 10/25/2006 1625   MONOABS 0.5 10/25/2006 1625   EOSABS 0.1 10/25/2006 1625   BASOSABS 0.0 10/25/2006 1625    No results found for: "POCLITH", "LITHIUM"   No results found for: "PHENYTOIN", "PHENOBARB", "VALPROATE", "CBMZ"   .res Assessment: Plan:    Plan:  Lexapro 20mg  daily -  has tapered off of it medication over the past several months. Xanax 1mg  TID to BID - mostly taking twice a day  D/C Wellbutrin XL 150mg  - denies seizure history.  Therapist Merwyn Katos  RTC 3 months  Patient  disabled and unable to work at this time with mental health and physical disabilities.  Discussed potential benefits, risk, and side effects of benzodiazepines to include potential risk of tolerance and dependence, as well as possible drowsiness. Advised patient not to drive if experiencing drowsiness and to take lowest possible effective dose to minimize risk of dependence and tolerance.  There are no diagnoses linked to this encounter.   Please see After Visit Summary for patient specific instructions.  Future Appointments  Date Time Provider Department Center  06/19/2023 11:20 AM Robley Matassa, Thereasa Solo, NP [facility] None    No orders of the defined types were placed in this encounter.     -------------------------------

## 2023-06-22 DIAGNOSIS — R509 Fever, unspecified: Secondary | ICD-10-CM | POA: Diagnosis not present

## 2023-06-22 DIAGNOSIS — R300 Dysuria: Secondary | ICD-10-CM | POA: Diagnosis not present

## 2023-07-04 DIAGNOSIS — L905 Scar conditions and fibrosis of skin: Secondary | ICD-10-CM | POA: Diagnosis not present

## 2023-07-04 DIAGNOSIS — D239 Other benign neoplasm of skin, unspecified: Secondary | ICD-10-CM | POA: Diagnosis not present

## 2023-07-07 ENCOUNTER — Other Ambulatory Visit: Payer: Self-pay | Admitting: Adult Health

## 2023-07-07 DIAGNOSIS — F411 Generalized anxiety disorder: Secondary | ICD-10-CM

## 2023-07-07 DIAGNOSIS — F410 Panic disorder [episodic paroxysmal anxiety] without agoraphobia: Secondary | ICD-10-CM

## 2023-07-13 DIAGNOSIS — L853 Xerosis cutis: Secondary | ICD-10-CM | POA: Diagnosis not present

## 2023-07-13 DIAGNOSIS — Z9889 Other specified postprocedural states: Secondary | ICD-10-CM | POA: Diagnosis not present

## 2023-07-13 DIAGNOSIS — M7989 Other specified soft tissue disorders: Secondary | ICD-10-CM | POA: Diagnosis not present

## 2023-07-16 DIAGNOSIS — Z9181 History of falling: Secondary | ICD-10-CM | POA: Diagnosis not present

## 2023-07-16 DIAGNOSIS — Z0000 Encounter for general adult medical examination without abnormal findings: Secondary | ICD-10-CM | POA: Diagnosis not present

## 2023-08-20 DIAGNOSIS — I517 Cardiomegaly: Secondary | ICD-10-CM | POA: Diagnosis not present

## 2023-08-22 DIAGNOSIS — E538 Deficiency of other specified B group vitamins: Secondary | ICD-10-CM | POA: Diagnosis not present

## 2023-08-22 DIAGNOSIS — I10 Essential (primary) hypertension: Secondary | ICD-10-CM | POA: Diagnosis not present

## 2023-08-22 DIAGNOSIS — J309 Allergic rhinitis, unspecified: Secondary | ICD-10-CM | POA: Diagnosis not present

## 2023-08-22 DIAGNOSIS — K589 Irritable bowel syndrome without diarrhea: Secondary | ICD-10-CM | POA: Diagnosis not present

## 2023-08-22 DIAGNOSIS — E559 Vitamin D deficiency, unspecified: Secondary | ICD-10-CM | POA: Diagnosis not present

## 2023-08-22 DIAGNOSIS — I4891 Unspecified atrial fibrillation: Secondary | ICD-10-CM | POA: Diagnosis not present

## 2023-08-22 DIAGNOSIS — Z9981 Dependence on supplemental oxygen: Secondary | ICD-10-CM | POA: Diagnosis not present

## 2023-08-22 DIAGNOSIS — G510 Bell's palsy: Secondary | ICD-10-CM | POA: Diagnosis not present

## 2023-08-22 DIAGNOSIS — Z7901 Long term (current) use of anticoagulants: Secondary | ICD-10-CM | POA: Diagnosis not present

## 2023-08-22 DIAGNOSIS — I480 Paroxysmal atrial fibrillation: Secondary | ICD-10-CM | POA: Diagnosis not present

## 2023-08-22 DIAGNOSIS — E785 Hyperlipidemia, unspecified: Secondary | ICD-10-CM | POA: Diagnosis not present

## 2023-10-12 ENCOUNTER — Other Ambulatory Visit: Payer: Self-pay | Admitting: Adult Health

## 2023-10-12 DIAGNOSIS — F411 Generalized anxiety disorder: Secondary | ICD-10-CM

## 2023-10-12 DIAGNOSIS — F410 Panic disorder [episodic paroxysmal anxiety] without agoraphobia: Secondary | ICD-10-CM

## 2023-10-12 NOTE — Telephone Encounter (Signed)
 Please call to schedule FU, was due in Jan.

## 2023-10-17 ENCOUNTER — Telehealth (INDEPENDENT_AMBULATORY_CARE_PROVIDER_SITE_OTHER): Payer: No Typology Code available for payment source | Admitting: Adult Health

## 2023-10-17 ENCOUNTER — Encounter: Payer: Self-pay | Admitting: Adult Health

## 2023-10-17 DIAGNOSIS — F411 Generalized anxiety disorder: Secondary | ICD-10-CM

## 2023-10-17 DIAGNOSIS — G4700 Insomnia, unspecified: Secondary | ICD-10-CM | POA: Diagnosis not present

## 2023-10-17 DIAGNOSIS — F422 Mixed obsessional thoughts and acts: Secondary | ICD-10-CM

## 2023-10-17 DIAGNOSIS — F331 Major depressive disorder, recurrent, moderate: Secondary | ICD-10-CM | POA: Diagnosis not present

## 2023-10-17 DIAGNOSIS — F410 Panic disorder [episodic paroxysmal anxiety] without agoraphobia: Secondary | ICD-10-CM | POA: Diagnosis not present

## 2023-10-17 MED ORDER — ALPRAZOLAM 1 MG PO TABS: ORAL_TABLET | ORAL | 2 refills | Status: DC

## 2023-10-17 NOTE — Progress Notes (Signed)
Sherry Middleton [id number] 07/28/1966 58 y.o.  Virtual Visit via Video Note  I connected with pt @ on 10/17/23 at 11:00 AM EST by a video enabled telemedicine application and verified that I am speaking with the correct person using two identifiers.   I discussed the limitations of evaluation and management by telemedicine and the availability of in person appointments. The patient expressed understanding and agreed to proceed.  I discussed the assessment and treatment plan with the patient. The patient was provided an opportunity to ask questions and all were answered. The patient agreed with the plan and demonstrated an understanding of the instructions.   The patient was advised to call back or seek an in-person evaluation if the symptoms worsen or if the condition fails to improve as anticipated.  I provided 25 minutes of non-face-to-face time during this encounter.  The patient was located at home.  The provider was located at [facility].   Sherry Gibbs, NP   Subjective:   Patient ID:  Sherry Middleton is a 58 y.o. (DOB Nov 29, 1965) female.  Chief Complaint: No chief complaint on file.   HPI Sherry Middleton presents for follow-up of depression, anxiety, insomnia, obsessional thoughts and acts and panic attacks.   Describes mood today as "ok". Pleasant. Tearful at times. Mood symptoms - reports depression, anxiety and irritability. Reports feeling angry - "a lot of frustration". Reports lacking interest and motivation. Reports panic attacks. Reports worry, rumination, and over thinking. Husband with medical issues - cardiac issues. Reports ongoing financial stressors. Reports some obsessional thoughts and acts - "worried about her children". Reports feeling overwhelmed at times - trying to utilize coping mechanisms. Mood is variable. Mostly staying home, but getting out some. Reports only driving short distances. Reports improved self care - trying to get up and  dressed every day. Reports chronic pain issues - daily. Working with a Paramedic. Taking medications as prescribed.  Energy levels variable. Active, does not have a regular exercise routine with physical disabilities. Also reporting falls. Enjoys some usual interests and activities. Married. Lives with husband and pets. Spending time with family - 3 sons. Attends church online. Appetite varies. Suffers from IBS. Working on weight loss. Sleeps better some nights than others. Averages 6.5 to 7 hours.  Reports focus and concentration difficulties at times. Completing tasks. Managing minimal aspects of household. Unable to work with current physical and emotional limitations. Receiving disability benefits. Denies SI or HI.  Denies AH or VH. Denies self harm. Denies substance use. Seeing therapist twice a month.  Review of Systems:  Review of Systems  Musculoskeletal:  Negative for gait problem.  Neurological:  Negative for tremors.  Psychiatric/Behavioral:         Please refer to HPI    Medications: I have reviewed the patient's current medications.  Current Outpatient Medications  Medication Sig Dispense Refill   ALPRAZolam (XANAX) 1 MG tablet TAKE 1 TABLET THREE TIMES DAILY AS NEEDED ANXIETY 90 tablet 2   ascorbic acid (VITAMIN C) 500 MG tablet Take by mouth.     colestipol (COLESTID) 1 g tablet Take 1 tablet (1 gram) by mouth once to twice a day as needed 60 tablet 3   dicyclomine (BENTYL) 10 MG capsule Take 1-2 capsules (10-20 mg total) by mouth every 8 (eight) hours as needed for spasms. 90 capsule 1   DUEXIS 800-26.6 MG TABS Take 1 tablet by mouth every 8 (eight) hours as needed (Pain).      ELIQUIS 5 MG TABS tablet  Take 5 mg by mouth 2 (two) times daily.     EPINEPHrine 0.3 mg/0.3 mL IJ SOAJ injection SMARTSIG:1 Pre-Filled Pen Syringe IM PRN     furosemide (LASIX) 20 MG tablet Take 20 mg by mouth daily.      ibuprofen (ADVIL) 800 MG tablet Take 800 mg by mouth 3 (three) times daily  as needed.     metoprolol tartrate (LOPRESSOR) 25 MG tablet Take 25 mg by mouth 2 (two) times daily.     montelukast (SINGULAIR) 10 MG tablet Take 10 mg by mouth at bedtime.     omeprazole (PRILOSEC) 40 MG capsule Take 1 capsule (40 mg total) by mouth 2 (two) times daily. 180 capsule 2   ondansetron (ZOFRAN ODT) 4 MG disintegrating tablet Take 1 tablet (4 mg total) by mouth every 6 (six) hours as needed for nausea or vomiting. 90 tablet 3   polyethylene glycol powder (GLYCOLAX/MIRALAX) 17 GM/SCOOP powder Take 0.5 Containers by mouth daily as needed for mild constipation or moderate constipation. As needed     Probiotic Product (ALIGN) 4 MG CAPS Take 4 mg by mouth daily.     spironolactone (ALDACTONE) 25 MG tablet Take 25 mg by mouth daily.      valsartan-hydrochlorothiazide (DIOVAN-HCT) 160-12.5 MG tablet Take 1 tablet by mouth daily.     Vitamin D, Ergocalciferol, (DRISDOL) 1.25 MG (50000 UT) CAPS capsule Take 50,000 Units by mouth 2 (two) times a week.      Wheat Dextrin (BENEFIBER DRINK MIX PO) Take 1 Package by mouth daily as needed (constipation).     No current facility-administered medications for this visit.    Medication Side Effects: None  Allergies:  Allergies  Allergen Reactions   Levaquin [Levofloxacin In D5w] Shortness Of Breath    avalox and cipro    Levofloxacin Anaphylaxis and Shortness Of Breath    avalox and cipro  avalox and cipro    Prednisone Palpitations    * pt reports she can take tapered doses * pt reports she can take tapered doses * pt reports she can take tapered doses   Other Other (See Comments)    Steroids Hyper sensitive/palpitations   Asa [Aspirin] Other (See Comments)    Ulcers   Ibuprofen Other (See Comments)    Ulcers   Sulfonamide Derivatives Other (See Comments)    Finger nails turn blue    Past Medical History:  Diagnosis Date   Anal fissure    Anemia    Anxiety    Depression    GERD (gastroesophageal reflux disease)    History of  weight change    Hypertension    Hypertension    Palpitations    Peptic ulcer disease    Scar    Swelling     Family History  Problem Relation Age of Onset   Alcoholism Father    Esophageal cancer Father    Heart disease Mother    Kidney disease Mother    Breast cancer Sister        1/2 sister   Diabetes Paternal Grandmother    Heart disease Paternal Grandmother    Diabetes Paternal Grandfather    Crohn's disease Cousin    Crohn's disease Niece    Colon cancer Neg Hx    Pancreatic cancer Neg Hx    Stomach cancer Neg Hx     Social History   Socioeconomic History   Marital status: Married    Spouse name: Not on file   Number of children:  Not on file   Years of education: Not on file   Highest education level: Not on file  Occupational History   Not on file  Tobacco Use   Smoking status: Never   Smokeless tobacco: Never  Vaping Use   Vaping status: Never Used  Substance and Sexual Activity   Alcohol use: No   Drug use: No   Sexual activity: Not on file  Other Topics Concern   Not on file  Social History Narrative   Not on file   Social Drivers of Health   Financial Resource Strain: Not on file  Food Insecurity: Not on file  Transportation Needs: Not on file  Physical Activity: Not on file  Stress: Not on file  Social Connections: Unknown (06/25/2023)   Received [facility]   Social Network    Social Network: Not on file  Intimate Partner Violence: Unknown (06/25/2023)   Received from [facility]   HITS    Physically Hurt: Not on file    Insult or Talk Down To: Not on file    Threaten Physical Harm: Not on file    Scream or Curse: Not on file    Past Medical History, Surgical history, Social history, and Family history were reviewed and updated as appropriate.   Please see review of systems for further details on the patient's review from today.   Objective:   Physical Exam:  There were no vitals taken for this visit.  Physical  Exam Constitutional:      General: She is not in acute distress. Musculoskeletal:        General: No deformity.  Neurological:     Mental Status: She is alert and oriented to person, place, and time.     Coordination: Coordination normal.  Psychiatric:        Attention and Perception: Attention and perception normal. She does not perceive auditory or visual hallucinations.        Mood and Affect: Affect is not labile, blunt, angry or inappropriate.        Speech: Speech normal.        Behavior: Behavior normal.        Thought Content: Thought content normal. Thought content is not paranoid or delusional. Thought content does not include homicidal or suicidal ideation. Thought content does not include homicidal or suicidal plan.        Cognition and Memory: Cognition and memory normal.        Judgment: Judgment normal.     Comments: Insight intact     Lab Review:     Component Value Date/Time   NA 140 10/14/2007 2047   K 4.0 10/14/2007 2047   CL 104 10/14/2007 2047   CO2 25 10/14/2007 2047   GLUCOSE 96 10/14/2007 2047   BUN 9 10/14/2007 2047   CREATININE 0.68 10/14/2007 2047   CALCIUM 8.8 10/14/2007 2047       Component Value Date/Time   WBC 7.3 10/14/2007 2047   RBC 3.98 10/14/2007 2047   HGB 8.3 (L) 10/14/2007 2047   HCT 30.3 (L) 10/14/2007 2047   PLT 380 10/14/2007 2047   MCV 76.1 (L) 10/14/2007 2047   MCHC 27.4 (L) 10/14/2007 2047   RDW 21.9 (H) 10/14/2007 2047   LYMPHSABS 2.0 10/25/2006 1625   MONOABS 0.5 10/25/2006 1625   EOSABS 0.1 10/25/2006 1625   BASOSABS 0.0 10/25/2006 1625    No results found for: "POCLITH", "LITHIUM"   No results found for: "PHENYTOIN", "PHENOBARB", "VALPROATE", "  CBMZ"   .res Assessment: Plan:    Plan:  Lexapro 20mg  daily - has tapered off of it medication over the past several months.  Xanax 1mg  TID   Therapist Merwyn Katos  RTC 6 months  25 minutes spent dedicated to the care of this patient on the date of this encounter  to include pre-visit review of records, ordering of medication, post visit documentation, and face-to-face time with the patient discussing depression, anxiety, insomnia, obsessional thoughts and acts and panic attacks. Discussed continuing current medication regimen.  Patient disabled and unable to work at this time with mental health and physical disabilities.  Discussed potential benefits, risk, and side effects of benzodiazepines to include potential risk of tolerance and dependence, as well as possible drowsiness. Advised patient not to drive if experiencing drowsiness and to take lowest possible effective dose to minimize risk of dependence and tolerance.  Diagnoses and all orders for this visit:  Major depressive disorder, recurrent episode, moderate (HCC)  Generalized anxiety disorder -     ALPRAZolam (XANAX) 1 MG tablet; TAKE 1 TABLET THREE TIMES DAILY AS NEEDED ANXIETY  Panic attacks -     ALPRAZolam (XANAX) 1 MG tablet; TAKE 1 TABLET THREE TIMES DAILY AS NEEDED ANXIETY  Insomnia, unspecified type  Mixed obsessional thoughts and acts     Please see After Visit Summary for patient specific instructions.  No future appointments.   No orders of the defined types were placed in this encounter.     -------------------------------

## 2023-10-24 DIAGNOSIS — Z1231 Encounter for screening mammogram for malignant neoplasm of breast: Secondary | ICD-10-CM | POA: Diagnosis not present

## 2023-10-24 NOTE — Telephone Encounter (Signed)
Had appt on 2/12

## 2023-11-13 ENCOUNTER — Other Ambulatory Visit: Payer: Self-pay | Admitting: Adult Health

## 2023-11-13 DIAGNOSIS — F410 Panic disorder [episodic paroxysmal anxiety] without agoraphobia: Secondary | ICD-10-CM

## 2023-11-13 DIAGNOSIS — F411 Generalized anxiety disorder: Secondary | ICD-10-CM

## 2023-11-28 DIAGNOSIS — K439 Ventral hernia without obstruction or gangrene: Secondary | ICD-10-CM | POA: Diagnosis not present

## 2023-11-29 DIAGNOSIS — F32A Depression, unspecified: Secondary | ICD-10-CM | POA: Diagnosis not present

## 2023-11-29 DIAGNOSIS — Z09 Encounter for follow-up examination after completed treatment for conditions other than malignant neoplasm: Secondary | ICD-10-CM | POA: Diagnosis not present

## 2023-11-29 DIAGNOSIS — F419 Anxiety disorder, unspecified: Secondary | ICD-10-CM | POA: Diagnosis not present

## 2023-11-29 DIAGNOSIS — K432 Incisional hernia without obstruction or gangrene: Secondary | ICD-10-CM | POA: Diagnosis not present

## 2023-12-11 DIAGNOSIS — Z6841 Body Mass Index (BMI) 40.0 and over, adult: Secondary | ICD-10-CM | POA: Diagnosis not present

## 2023-12-11 DIAGNOSIS — R2681 Unsteadiness on feet: Secondary | ICD-10-CM | POA: Diagnosis not present

## 2023-12-11 DIAGNOSIS — M1990 Unspecified osteoarthritis, unspecified site: Secondary | ICD-10-CM | POA: Diagnosis not present

## 2023-12-11 DIAGNOSIS — K589 Irritable bowel syndrome without diarrhea: Secondary | ICD-10-CM | POA: Diagnosis not present

## 2023-12-11 DIAGNOSIS — Z9981 Dependence on supplemental oxygen: Secondary | ICD-10-CM | POA: Diagnosis not present

## 2023-12-11 DIAGNOSIS — F4312 Post-traumatic stress disorder, chronic: Secondary | ICD-10-CM | POA: Diagnosis not present

## 2023-12-11 DIAGNOSIS — I10 Essential (primary) hypertension: Secondary | ICD-10-CM | POA: Diagnosis not present

## 2023-12-11 DIAGNOSIS — Z008 Encounter for other general examination: Secondary | ICD-10-CM | POA: Diagnosis not present

## 2023-12-11 DIAGNOSIS — F411 Generalized anxiety disorder: Secondary | ICD-10-CM | POA: Diagnosis not present

## 2023-12-11 DIAGNOSIS — I4891 Unspecified atrial fibrillation: Secondary | ICD-10-CM | POA: Diagnosis not present

## 2023-12-11 DIAGNOSIS — F3342 Major depressive disorder, recurrent, in full remission: Secondary | ICD-10-CM | POA: Diagnosis not present

## 2023-12-14 DIAGNOSIS — R0602 Shortness of breath: Secondary | ICD-10-CM | POA: Diagnosis not present

## 2023-12-14 DIAGNOSIS — I4891 Unspecified atrial fibrillation: Secondary | ICD-10-CM | POA: Diagnosis not present

## 2023-12-19 DIAGNOSIS — K589 Irritable bowel syndrome without diarrhea: Secondary | ICD-10-CM | POA: Diagnosis not present

## 2023-12-19 DIAGNOSIS — I10 Essential (primary) hypertension: Secondary | ICD-10-CM | POA: Diagnosis not present

## 2023-12-19 DIAGNOSIS — F32A Depression, unspecified: Secondary | ICD-10-CM | POA: Diagnosis not present

## 2023-12-19 DIAGNOSIS — E538 Deficiency of other specified B group vitamins: Secondary | ICD-10-CM | POA: Diagnosis not present

## 2023-12-19 DIAGNOSIS — E559 Vitamin D deficiency, unspecified: Secondary | ICD-10-CM | POA: Diagnosis not present

## 2023-12-19 DIAGNOSIS — G510 Bell's palsy: Secondary | ICD-10-CM | POA: Diagnosis not present

## 2023-12-19 DIAGNOSIS — F419 Anxiety disorder, unspecified: Secondary | ICD-10-CM | POA: Diagnosis not present

## 2023-12-19 DIAGNOSIS — F330 Major depressive disorder, recurrent, mild: Secondary | ICD-10-CM | POA: Diagnosis not present

## 2023-12-19 DIAGNOSIS — E2839 Other primary ovarian failure: Secondary | ICD-10-CM | POA: Diagnosis not present

## 2023-12-19 DIAGNOSIS — E785 Hyperlipidemia, unspecified: Secondary | ICD-10-CM | POA: Diagnosis not present

## 2023-12-19 DIAGNOSIS — I4891 Unspecified atrial fibrillation: Secondary | ICD-10-CM | POA: Diagnosis not present

## 2023-12-19 DIAGNOSIS — J309 Allergic rhinitis, unspecified: Secondary | ICD-10-CM | POA: Diagnosis not present

## 2024-01-13 DIAGNOSIS — I4891 Unspecified atrial fibrillation: Secondary | ICD-10-CM | POA: Diagnosis not present

## 2024-01-13 DIAGNOSIS — R0602 Shortness of breath: Secondary | ICD-10-CM | POA: Diagnosis not present

## 2024-01-31 DIAGNOSIS — I509 Heart failure, unspecified: Secondary | ICD-10-CM | POA: Diagnosis not present

## 2024-01-31 DIAGNOSIS — I4891 Unspecified atrial fibrillation: Secondary | ICD-10-CM | POA: Diagnosis not present

## 2024-01-31 DIAGNOSIS — I517 Cardiomegaly: Secondary | ICD-10-CM | POA: Diagnosis not present

## 2024-01-31 DIAGNOSIS — R079 Chest pain, unspecified: Secondary | ICD-10-CM | POA: Diagnosis not present

## 2024-01-31 DIAGNOSIS — I110 Hypertensive heart disease with heart failure: Secondary | ICD-10-CM | POA: Diagnosis not present

## 2024-02-01 DIAGNOSIS — I509 Heart failure, unspecified: Secondary | ICD-10-CM | POA: Diagnosis not present

## 2024-02-01 DIAGNOSIS — I517 Cardiomegaly: Secondary | ICD-10-CM | POA: Diagnosis not present

## 2024-02-01 DIAGNOSIS — F419 Anxiety disorder, unspecified: Secondary | ICD-10-CM | POA: Diagnosis not present

## 2024-02-01 DIAGNOSIS — I480 Paroxysmal atrial fibrillation: Secondary | ICD-10-CM | POA: Diagnosis not present

## 2024-02-01 DIAGNOSIS — I4891 Unspecified atrial fibrillation: Secondary | ICD-10-CM | POA: Diagnosis not present

## 2024-02-01 DIAGNOSIS — Z79899 Other long term (current) drug therapy: Secondary | ICD-10-CM | POA: Diagnosis not present

## 2024-02-01 DIAGNOSIS — I420 Dilated cardiomyopathy: Secondary | ICD-10-CM | POA: Diagnosis not present

## 2024-02-01 DIAGNOSIS — Z791 Long term (current) use of non-steroidal anti-inflammatories (NSAID): Secondary | ICD-10-CM | POA: Diagnosis not present

## 2024-02-01 DIAGNOSIS — I2109 ST elevation (STEMI) myocardial infarction involving other coronary artery of anterior wall: Secondary | ICD-10-CM | POA: Diagnosis not present

## 2024-02-01 DIAGNOSIS — Z6841 Body Mass Index (BMI) 40.0 and over, adult: Secondary | ICD-10-CM | POA: Diagnosis not present

## 2024-02-01 DIAGNOSIS — Z888 Allergy status to other drugs, medicaments and biological substances status: Secondary | ICD-10-CM | POA: Diagnosis not present

## 2024-02-01 DIAGNOSIS — Z886 Allergy status to analgesic agent status: Secondary | ICD-10-CM | POA: Diagnosis not present

## 2024-02-01 DIAGNOSIS — R079 Chest pain, unspecified: Secondary | ICD-10-CM | POA: Diagnosis not present

## 2024-02-01 DIAGNOSIS — I10 Essential (primary) hypertension: Secondary | ICD-10-CM | POA: Diagnosis not present

## 2024-02-01 DIAGNOSIS — I110 Hypertensive heart disease with heart failure: Secondary | ICD-10-CM | POA: Diagnosis not present

## 2024-02-01 DIAGNOSIS — Z882 Allergy status to sulfonamides status: Secondary | ICD-10-CM | POA: Diagnosis not present

## 2024-02-01 DIAGNOSIS — Z881 Allergy status to other antibiotic agents status: Secondary | ICD-10-CM | POA: Diagnosis not present

## 2024-02-01 DIAGNOSIS — K219 Gastro-esophageal reflux disease without esophagitis: Secondary | ICD-10-CM | POA: Diagnosis not present

## 2024-02-01 DIAGNOSIS — R0789 Other chest pain: Secondary | ICD-10-CM | POA: Diagnosis not present

## 2024-02-06 DIAGNOSIS — I4891 Unspecified atrial fibrillation: Secondary | ICD-10-CM | POA: Diagnosis not present

## 2024-02-06 DIAGNOSIS — F419 Anxiety disorder, unspecified: Secondary | ICD-10-CM | POA: Diagnosis not present

## 2024-02-06 DIAGNOSIS — F330 Major depressive disorder, recurrent, mild: Secondary | ICD-10-CM | POA: Diagnosis not present

## 2024-02-06 DIAGNOSIS — Z09 Encounter for follow-up examination after completed treatment for conditions other than malignant neoplasm: Secondary | ICD-10-CM | POA: Diagnosis not present

## 2024-02-13 DIAGNOSIS — I4891 Unspecified atrial fibrillation: Secondary | ICD-10-CM | POA: Diagnosis not present

## 2024-02-13 DIAGNOSIS — R0602 Shortness of breath: Secondary | ICD-10-CM | POA: Diagnosis not present

## 2024-02-15 ENCOUNTER — Other Ambulatory Visit: Payer: Self-pay | Admitting: Adult Health

## 2024-02-15 DIAGNOSIS — F410 Panic disorder [episodic paroxysmal anxiety] without agoraphobia: Secondary | ICD-10-CM

## 2024-02-15 DIAGNOSIS — F411 Generalized anxiety disorder: Secondary | ICD-10-CM

## 2024-03-04 DIAGNOSIS — Z6841 Body Mass Index (BMI) 40.0 and over, adult: Secondary | ICD-10-CM | POA: Diagnosis not present

## 2024-03-04 DIAGNOSIS — R42 Dizziness and giddiness: Secondary | ICD-10-CM | POA: Diagnosis not present

## 2024-03-04 DIAGNOSIS — I10 Essential (primary) hypertension: Secondary | ICD-10-CM | POA: Diagnosis not present

## 2024-03-04 DIAGNOSIS — I158 Other secondary hypertension: Secondary | ICD-10-CM | POA: Diagnosis not present

## 2024-03-04 DIAGNOSIS — Z7901 Long term (current) use of anticoagulants: Secondary | ICD-10-CM | POA: Diagnosis not present

## 2024-03-04 DIAGNOSIS — I517 Cardiomegaly: Secondary | ICD-10-CM | POA: Diagnosis not present

## 2024-03-14 DIAGNOSIS — I4891 Unspecified atrial fibrillation: Secondary | ICD-10-CM | POA: Diagnosis not present

## 2024-03-14 DIAGNOSIS — R0602 Shortness of breath: Secondary | ICD-10-CM | POA: Diagnosis not present

## 2024-03-19 DIAGNOSIS — I10 Essential (primary) hypertension: Secondary | ICD-10-CM | POA: Diagnosis not present

## 2024-03-19 DIAGNOSIS — G510 Bell's palsy: Secondary | ICD-10-CM | POA: Diagnosis not present

## 2024-03-19 DIAGNOSIS — J309 Allergic rhinitis, unspecified: Secondary | ICD-10-CM | POA: Diagnosis not present

## 2024-03-19 DIAGNOSIS — E785 Hyperlipidemia, unspecified: Secondary | ICD-10-CM | POA: Diagnosis not present

## 2024-03-19 DIAGNOSIS — K589 Irritable bowel syndrome without diarrhea: Secondary | ICD-10-CM | POA: Diagnosis not present

## 2024-03-19 DIAGNOSIS — E538 Deficiency of other specified B group vitamins: Secondary | ICD-10-CM | POA: Diagnosis not present

## 2024-03-19 DIAGNOSIS — Z6841 Body Mass Index (BMI) 40.0 and over, adult: Secondary | ICD-10-CM | POA: Diagnosis not present

## 2024-03-19 DIAGNOSIS — F330 Major depressive disorder, recurrent, mild: Secondary | ICD-10-CM | POA: Diagnosis not present

## 2024-03-19 DIAGNOSIS — F419 Anxiety disorder, unspecified: Secondary | ICD-10-CM | POA: Diagnosis not present

## 2024-03-19 DIAGNOSIS — I4891 Unspecified atrial fibrillation: Secondary | ICD-10-CM | POA: Diagnosis not present

## 2024-03-19 DIAGNOSIS — E2839 Other primary ovarian failure: Secondary | ICD-10-CM | POA: Diagnosis not present

## 2024-03-19 DIAGNOSIS — E559 Vitamin D deficiency, unspecified: Secondary | ICD-10-CM | POA: Diagnosis not present

## 2024-03-21 DIAGNOSIS — E785 Hyperlipidemia, unspecified: Secondary | ICD-10-CM | POA: Diagnosis not present

## 2024-03-21 DIAGNOSIS — R7303 Prediabetes: Secondary | ICD-10-CM | POA: Diagnosis not present

## 2024-04-06 ENCOUNTER — Ambulatory Visit (HOSPITAL_BASED_OUTPATIENT_CLINIC_OR_DEPARTMENT_OTHER)
Admission: RE | Admit: 2024-04-06 | Discharge: 2024-04-06 | Disposition: A | Discharge status: Home / Self Care | Source: Ambulatory Visit | Attending: Family Medicine

## 2024-04-06 ENCOUNTER — Encounter (HOSPITAL_BASED_OUTPATIENT_CLINIC_OR_DEPARTMENT_OTHER): Payer: Self-pay

## 2024-04-06 VITALS — BP 144/88 | HR 58 | Temp 98.6°F | Resp 20

## 2024-04-06 DIAGNOSIS — L03213 Periorbital cellulitis: Secondary | ICD-10-CM

## 2024-04-06 MED ORDER — ERYTHROMYCIN 5 MG/GM OP OINT: TOPICAL_OINTMENT | OPHTHALMIC | 0 refills | Status: AC

## 2024-04-06 MED ORDER — AMOXICILLIN 500 MG PO CAPS
500.0000 mg | ORAL_CAPSULE | Freq: Three times a day (TID) | ORAL | 0 refills | Status: AC
Start: 2024-04-06 — End: ?

## 2024-04-06 NOTE — Discharge Instructions (Addendum)
 Treating you for infection in the tissue around the eye.  Medication as prescribed. Warm compresses.  Follow up as needed

## 2024-04-06 NOTE — ED Triage Notes (Signed)
 Thought she was developing a stye to her left eye 4 days ago. Significant swelling to upper eyelid of left eye. Patient reports a lot of burning and itching. No blurred vision. Starting to water.

## 2024-04-06 NOTE — ED Provider Notes (Signed)
 [facility]    CSN: [id number] Arrival date & time: 04/06/24  9061      History   Chief Complaint Chief Complaint  Patient presents with   Eye Problem    Thought it was a stye,  seems to be worse - Entered by patient    HPI Sherry Middleton is a 58 y.o. female.   Patient is a 58 year old female who presents today with left upper lid swelling and swelling around the periorbital area. Thought she was developing a stye to her left eye 4 days ago. Significant swelling to upper eyelid of left eye. Patient reports a lot of burning and itching. No blurred vision.  Denies any fever.  She has been doing warm compresses with no relief   Eye Problem   Past Medical History:  Diagnosis Date   Anal fissure    Anemia    Anxiety    Depression    GERD (gastroesophageal reflux disease)    History of weight change    Hypertension    Hypertension    Palpitations    Peptic ulcer disease    Scar    Swelling     Patient Active Problem List   Diagnosis Date Noted   Abdominal pain, epigastric    Dysphagia    Gastroesophageal reflux disease    Bloating    Change in bowel habit    Murmur, cardiac 04/13/2016   Morbid (severe) obesity due to excess calories (HCC) 04/13/2016   Benign essential hypertension 04/13/2016   Asthma 09/01/2015   S/P repair of ventral hernia 09/01/2015   Fibroids 03/30/2015   Impingement syndrome of right shoulder 12/29/2014   Generalized anxiety disorder 02/08/2013   Major depressive disorder, recurrent episode (HCC) 02/08/2013   Housing or economic circumstance 06/18/2012   VIRAL GASTROENTERITIS 10/25/2006   Acute upper respiratory infection 10/25/2006   ANEMIA-IRON DEFICIENCY 09/05/2006   OBESITY 06/07/2006   Essential hypertension 06/07/2006   METRORRHAGIA 06/07/2006   Lateral epicondylitis 06/07/2006    Past Surgical History:  Procedure Laterality Date   BIOPSY  06/10/2019   Procedure: BIOPSY;  Surgeon: Leigh Elspeth SQUIBB, MD;   Location: [facility];  Service: Gastroenterology;;   CHOLECYSTECTOMY     club feet surgery     as a child   COLONOSCOPY WITH PROPOFOL  N/A 06/10/2019   Procedure: COLONOSCOPY WITH PROPOFOL ;  Surgeon: Leigh Elspeth SQUIBB, MD;  Location: [facility];  Service: Gastroenterology;  Laterality: N/A;   DILATION AND CURETTAGE OF UTERUS     DILATION AND CURETTAGE OF UTERUS     DILATION AND CURETTAGE OF UTERUS     x 2   ESOPHAGOGASTRODUODENOSCOPY (EGD) WITH PROPOFOL  N/A 06/10/2019   Procedure: ESOPHAGOGASTRODUODENOSCOPY (EGD) WITH PROPOFOL ;  Surgeon: Leigh Elspeth SQUIBB, MD;  Location: [facility];  Service: Gastroenterology;  Laterality: N/A;   HERNIA REPAIR     KNEE ARTHROSCOPY Left    PARTIAL HYSTERECTOMY     ovaries still intact   POLYPECTOMY  06/10/2019   Procedure: POLYPECTOMY;  Surgeon: Leigh Elspeth SQUIBB, MD;  Location: [facility];  Service: Gastroenterology;;   TUBAL LIGATION      OB History   No obstetric history on file.      Home Medications    Prior to Admission medications   Medication Sig Start Date End Date Taking? Authorizing Provider  amoxicillin  (AMOXIL ) 500 MG capsule Take 1 capsule (500 mg total) by mouth 3 (three) times daily. 04/06/24  Yes Adah Wilbert LABOR, FNP  erythromycin  ophthalmic ointment Place  a 1/2 inch ribbon of ointment into the lower eyelid 3-4 times a day 04/06/24  Yes Gen Clagg A, FNP  ALPRAZolam  (XANAX ) 1 MG tablet TAKE 1 TABLET BY MOUTH THREE TIMES A DAY AS NEEDED FOR ANXIETY 02/15/24   Mozingo, Regina Nattalie, NP  ELIQUIS 5 MG TABS tablet Take 5 mg by mouth 2 (two) times daily. 09/07/21   [provider]  EPINEPHrine 0.3 mg/0.3 mL IJ SOAJ injection SMARTSIG:1 Pre-Filled Pen Syringe IM PRN 04/19/20   [provider]  furosemide (LASIX) 20 MG tablet Take 20 mg by mouth daily.  02/11/19   [provider]  metoprolol tartrate (LOPRESSOR) 25 MG tablet Take 25 mg by mouth 2 (two) times daily. 09/22/21   [provider]   omeprazole  (PRILOSEC) 40 MG capsule Take 1 capsule (40 mg total) by mouth 2 (two) times daily. 06/15/21   Armbruster, Elspeth SQUIBB, MD  ondansetron  (ZOFRAN  ODT) 4 MG disintegrating tablet Take 1 tablet (4 mg total) by mouth every 6 (six) hours as needed for nausea or vomiting. 10/08/19   Armbruster, Elspeth SQUIBB, MD  polyethylene glycol powder (GLYCOLAX/MIRALAX) 17 GM/SCOOP powder Take 0.5 Containers by mouth daily as needed for mild constipation or moderate constipation. As needed 04/10/19   [provider]  Probiotic Product (ALIGN) 4 MG CAPS Take 4 mg by mouth daily.    [provider]  spironolactone (ALDACTONE) 25 MG tablet Take 25 mg by mouth daily.  04/14/19   [provider]  valsartan-hydrochlorothiazide (DIOVAN-HCT) 160-12.5 MG tablet Take 1 tablet by mouth daily. 02/04/21   [provider]  Vitamin D , Ergocalciferol , (DRISDOL) 1.25 MG (50000 UT) CAPS capsule Take 50,000 Units by mouth 2 (two) times a week.  02/11/19   [provider]    Family History Family History  Problem Relation Age of Onset   Alcoholism Father    Esophageal cancer Father    Heart disease Mother    Kidney disease Mother    Breast cancer Sister        1/2 sister   Diabetes Paternal Grandmother    Heart disease Paternal Grandmother    Diabetes Paternal Grandfather    Crohn's disease Cousin    Crohn's disease Niece    Colon cancer Neg Hx    Pancreatic cancer Neg Hx    Stomach cancer Neg Hx     Social History Social History   Tobacco Use   Smoking status: Never   Smokeless tobacco: Never  Vaping Use   Vaping status: Never Used  Substance Use Topics   Alcohol use: No   Drug use: No     Allergies   Levaquin [levofloxacin in d5w], Levofloxacin, Prednisone, Other, Asa [aspirin], Ibuprofen, and Sulfonamide derivatives   Review of Systems Review of Systems See HPI  Physical Exam Triage Vital Signs ED Triage Vitals  Encounter Vitals Group     BP 04/06/24 0956  (!) 144/88     Girls Systolic BP Percentile --      Girls Diastolic BP Percentile --      Boys Systolic BP Percentile --      Boys Diastolic BP Percentile --      Pulse Rate 04/06/24 0956 (!) 58     Resp 04/06/24 0956 20     Temp 04/06/24 0956 98.6 F (37 C)     Temp Source 04/06/24 0956 Oral     SpO2 04/06/24 0956 95 %     Weight --      Height --  Head Circumference --      Peak Flow --      Pain Score 04/06/24 0958 4     Pain Loc --      Pain Education --      Exclude from Growth Chart --    No data found.  Updated Vital Signs BP (!) 144/88 (BP Location: Right Arm)   Pulse (!) 58   Temp 98.6 F (37 C) (Oral)   Resp 20   SpO2 95%   Visual Acuity Right Eye Distance:   Left Eye Distance:   Bilateral Distance:    Right Eye Near:   Left Eye Near:    Bilateral Near:     Physical Exam Vitals and nursing note reviewed.  Constitutional:      General: She is not in acute distress.    Appearance: Normal appearance. She is not ill-appearing, toxic-appearing or diaphoretic.  Eyes:     General:        Right eye: No discharge.        Left eye: No discharge.     Extraocular Movements: Extraocular movements intact.     Conjunctiva/sclera: Conjunctivae normal.     Pupils: Pupils are equal, round, and reactive to light.     Comments: Significant left upper lid swelling, erythema with small stye to inner corner of the eye Mild periorbital swelling with tenderness No orbital tenderness   Pulmonary:     Effort: Pulmonary effort is normal.  Neurological:     Mental Status: She is alert.  Psychiatric:        Mood and Affect: Mood normal.      UC Treatments / Results  Labs (all labs ordered are listed, but only abnormal results are displayed) Labs Reviewed - No data to display  EKG   Radiology No results found.  Procedures Procedures (including critical care time)  Medications Ordered in UC Medications - No data to display  Initial Impression /  Assessment and Plan / UC Course  I have reviewed the triage vital signs and the nursing notes.  Pertinent labs & imaging results that were available during my care of the patient were reviewed by me and considered in my medical decision making (see chart for details).     Periorbital cellulitis of the left eye-treating with erythromycin  ointment for the blepharitis and amoxicillin  for the periorbital cellulitis. He may continue warm compresses as tolerated. Close watch over the next 24 to 48 hours and follow-up for any continued or worsening issues Final Clinical Impressions(s) / UC Diagnoses   Final diagnoses:  Periorbital cellulitis of left eye     Discharge Instructions      Treating you for infection in the tissue around the eye.  Medication as prescribed. Warm compresses.  Follow up as needed    ED Prescriptions     Medication Sig Dispense Auth. Provider   erythromycin  ophthalmic ointment Place a 1/2 inch ribbon of ointment into the lower eyelid 3-4 times a day 3.5 g Rene Sizelove A, FNP   amoxicillin  (AMOXIL ) 500 MG capsule Take 1 capsule (500 mg total) by mouth 3 (three) times daily. 21 capsule Adah Wilbert LABOR, FNP      PDMP not reviewed this encounter.   Adah Wilbert LABOR, FNP 04/06/24 1350

## 2024-04-07 DIAGNOSIS — R0981 Nasal congestion: Secondary | ICD-10-CM | POA: Diagnosis not present

## 2024-04-14 DIAGNOSIS — R0602 Shortness of breath: Secondary | ICD-10-CM | POA: Diagnosis not present

## 2024-04-14 DIAGNOSIS — I4891 Unspecified atrial fibrillation: Secondary | ICD-10-CM | POA: Diagnosis not present

## 2024-05-15 DIAGNOSIS — R0602 Shortness of breath: Secondary | ICD-10-CM | POA: Diagnosis not present

## 2024-05-15 DIAGNOSIS — I4891 Unspecified atrial fibrillation: Secondary | ICD-10-CM | POA: Diagnosis not present

## 2024-05-20 ENCOUNTER — Other Ambulatory Visit: Payer: Self-pay

## 2024-05-20 ENCOUNTER — Telehealth: Payer: Self-pay | Admitting: Adult Health

## 2024-05-20 DIAGNOSIS — F411 Generalized anxiety disorder: Secondary | ICD-10-CM

## 2024-05-20 DIAGNOSIS — F410 Panic disorder [episodic paroxysmal anxiety] without agoraphobia: Secondary | ICD-10-CM

## 2024-05-20 MED ORDER — ALPRAZOLAM 1 MG PO TABS
ORAL_TABLET | ORAL | 0 refills | Status: DC
Start: 2024-05-20 — End: 2024-06-02

## 2024-05-20 NOTE — Telephone Encounter (Signed)
 Pended

## 2024-05-20 NOTE — Telephone Encounter (Signed)
 Pt called at 10:30a requesting refill of Alprazolam  to   [id number] - [place], [place] - [address] [place] [address] Phone: [phone]  Fax: [phone]    Next appt 9/29

## 2024-05-22 ENCOUNTER — Other Ambulatory Visit: Payer: Self-pay | Admitting: Adult Health

## 2024-05-22 DIAGNOSIS — F411 Generalized anxiety disorder: Secondary | ICD-10-CM

## 2024-05-22 DIAGNOSIS — F410 Panic disorder [episodic paroxysmal anxiety] without agoraphobia: Secondary | ICD-10-CM

## 2024-06-02 ENCOUNTER — Telehealth: Admitting: Adult Health

## 2024-06-02 ENCOUNTER — Encounter: Payer: Self-pay | Admitting: Adult Health

## 2024-06-02 DIAGNOSIS — F331 Major depressive disorder, recurrent, moderate: Secondary | ICD-10-CM | POA: Diagnosis not present

## 2024-06-02 DIAGNOSIS — F410 Panic disorder [episodic paroxysmal anxiety] without agoraphobia: Secondary | ICD-10-CM | POA: Diagnosis not present

## 2024-06-02 DIAGNOSIS — G4700 Insomnia, unspecified: Secondary | ICD-10-CM

## 2024-06-02 DIAGNOSIS — F422 Mixed obsessional thoughts and acts: Secondary | ICD-10-CM | POA: Diagnosis not present

## 2024-06-02 DIAGNOSIS — F411 Generalized anxiety disorder: Secondary | ICD-10-CM | POA: Diagnosis not present

## 2024-06-02 MED ORDER — ALPRAZOLAM 1 MG PO TABS: ORAL_TABLET | ORAL | 2 refills | Status: DC

## 2024-06-02 NOTE — Progress Notes (Signed)
 Sherry Middleton [id number] 1965/11/05 58 y.o.  Virtual Visit via Video Note  I connected with pt @ on 06/02/24 at 12:00 PM EDT by a video enabled telemedicine application and verified that I am speaking with the correct person using two identifiers.   I discussed the limitations of evaluation and management by telemedicine and the availability of in person appointments. The patient expressed understanding and agreed to proceed.  I discussed the assessment and treatment plan with the patient. The patient was provided an opportunity to ask questions and all were answered. The patient agreed with the plan and demonstrated an understanding of the instructions.   The patient was advised to call back or seek an in-person evaluation if the symptoms worsen or if the condition fails to improve as anticipated.  I provided 25 minutes of non-face-to-face time during this encounter.  The patient was located at home.  The provider was located at [facility].   Sherry LOISE Sayers, NP   Subjective:   Patient ID:  Sherry Middleton is a 58 y.o. (DOB [birth year]) female.  Chief Complaint: No chief complaint on file.   HPI Sherry Middleton presents for follow-up of depression, anxiety, insomnia, obsessional thoughts and acts and panic attacks.   Describes mood today as not too good. Pleasant. Tearful at times. Mood symptoms - reports decreased depression and irritability. Reports lacking interest and motivation. Reports increased anxiety with recent events. Reports she and family have recently been stalked - the police was made aware - not being bothered now. Feels like she is in a fight or flight place. Reports she is meeting with her therapist tomorrow. Reports panic attacks. Reports worry, rumination, and over thinking. Stating I was feeling like things were manageable, but not now. Reports some obsessional thoughts and acts. Reports feeling overwhelmed a lot of times - cooking,  cleaning and business things. Husband with medical issues - doing better. Reports ongoing financial stressors. Mostly staying home, but getting out some. Reports only driving short distances. Reports improved self care - trying to do the things she needs to do. Reports changing clothes every day. Reports chronic pain issues - back pain. Reports mood is up and down over the past 3 weeks. Working with a Paramedic. Taking medications as prescribed.  Energy levels lower - but better in the mornings. Active, does not have a regular exercise routine with physical disabilities. Denies falls over the past 4 months. Enjoys some usual interests and activities. Married. Lives with husband and pets. Spending time with family - 3 sons. Attends church online. Appetite varies. Suffers from IBS. Working on weight loss - to date 30 pounds. Sleeps better some nights than others. Averages 5 to 7 hours.  Reports focus and concentration difficulties at times. Completing tasks. Managing minimal aspects of household. Unable to work with current physical and emotional limitations. Receiving disability benefits. Denies SI or HI.  Denies AH or VH. Denies self harm. Denies substance use. Seeing therapist twice a month.  Review of Systems:  Review of Systems  Musculoskeletal:  Negative for gait problem.  Neurological:  Negative for tremors.  Psychiatric/Behavioral:         Please refer to HPI   Medications: I have reviewed the patient's current medications.  Current Outpatient Medications  Medication Sig Dispense Refill   ALPRAZolam  (XANAX ) 1 MG tablet TAKE 1 TABLET BY MOUTH THREE TIMES A DAY AS NEEDED FOR ANXIETY 90 tablet 2   amoxicillin  (AMOXIL ) 500 MG capsule Take 1 capsule (500 mg total) by  mouth 3 (three) times daily. 21 capsule 0   ELIQUIS 5 MG TABS tablet Take 5 mg by mouth 2 (two) times daily.     EPINEPHrine 0.3 mg/0.3 mL IJ SOAJ injection SMARTSIG:1 Pre-Filled Pen Syringe IM PRN     erythromycin   ophthalmic ointment Place a 1/2 inch ribbon of ointment into the lower eyelid 3-4 times a day 3.5 g 0   furosemide (LASIX) 20 MG tablet Take 20 mg by mouth daily.      metoprolol tartrate (LOPRESSOR) 25 MG tablet Take 25 mg by mouth 2 (two) times daily.     omeprazole  (PRILOSEC) 40 MG capsule Take 1 capsule (40 mg total) by mouth 2 (two) times daily. 180 capsule 2   ondansetron  (ZOFRAN  ODT) 4 MG disintegrating tablet Take 1 tablet (4 mg total) by mouth every 6 (six) hours as needed for nausea or vomiting. 90 tablet 3   polyethylene glycol powder (GLYCOLAX/MIRALAX) 17 GM/SCOOP powder Take 0.5 Containers by mouth daily as needed for mild constipation or moderate constipation. As needed     Probiotic Product (ALIGN) 4 MG CAPS Take 4 mg by mouth daily.     spironolactone (ALDACTONE) 25 MG tablet Take 25 mg by mouth daily.      valsartan-hydrochlorothiazide (DIOVAN-HCT) 160-12.5 MG tablet Take 1 tablet by mouth daily.     Vitamin D , Ergocalciferol , (DRISDOL) 1.25 MG (50000 UT) CAPS capsule Take 50,000 Units by mouth 2 (two) times a week.      No current facility-administered medications for this visit.    Medication Side Effects: None  Allergies:  Allergies  Allergen Reactions   Levaquin [Levofloxacin In D5w] Shortness Of Breath    avalox and cipro    Levofloxacin Anaphylaxis and Shortness Of Breath    avalox and cipro  avalox and cipro    Prednisone Palpitations    * pt reports she can take tapered doses * pt reports she can take tapered doses * pt reports she can take tapered doses   Other Other (See Comments)    Steroids Hyper sensitive/palpitations   Asa [Aspirin] Other (See Comments)    Ulcers   Ibuprofen Other (See Comments)    Ulcers   Sulfonamide Derivatives Other (See Comments)    Finger nails turn blue    Past Medical History:  Diagnosis Date   Anal fissure    Anemia    Anxiety    Depression    GERD (gastroesophageal reflux disease)    History of weight change     Hypertension    Hypertension    Palpitations    Peptic ulcer disease    Scar    Swelling     Family History  Problem Relation Age of Onset   Alcoholism Father    Esophageal cancer Father    Heart disease Mother    Kidney disease Mother    Breast cancer Sister        1/2 sister   Diabetes Paternal Grandmother    Heart disease Paternal Grandmother    Diabetes Paternal Grandfather    Crohn's disease Cousin    Crohn's disease Niece    Colon cancer Neg Hx    Pancreatic cancer Neg Hx    Stomach cancer Neg Hx     Social History   Socioeconomic History   Marital status: Married    Spouse name: Not on file   Number of children: Not on file   Years of education: Not on file   Highest education level: Not  on file  Occupational History   Not on file  Tobacco Use   Smoking status: Never   Smokeless tobacco: Never  Vaping Use   Vaping status: Never Used  Substance and Sexual Activity   Alcohol use: No   Drug use: No   Sexual activity: Not on file  Other Topics Concern   Not on file  Social History Narrative   Not on file   Social Drivers of Health   Financial Resource Strain: Not on file  Food Insecurity: Low Risk  (02/01/2024)   Received from [facility]   Hunger Vital Sign    Within the past 12 months, you worried that your food would run out before you got money to buy more: Never true    Within the past 12 months, the food you bought just didn't last and you didn't have money to get more. : Never true  Transportation Needs: No Transportation Needs (02/01/2024)   Received from [facility]    In the past 12 months, has lack of reliable transportation kept you from medical appointments, meetings, work or from getting things needed for daily living? : No  Physical Activity: Not on file  Stress: Not on file  Social Connections: Unknown (06/25/2023)   Received from [facility]   Social Network    Social Network: Not on file  Intimate Partner  Violence: Unknown (06/25/2023)   Received from [facility]   HITS    Physically Hurt: Not on file    Insult or Talk Down To: Not on file    Threaten Physical Harm: Not on file    Scream or Curse: Not on file    Past Medical History, Surgical history, Social history, and Family history were reviewed and updated as appropriate.   Please see review of systems for further details on the patient's review from today.   Objective:   Physical Exam:  There were no vitals taken for this visit.  Physical Exam Constitutional:      General: She is not in acute distress. Musculoskeletal:        General: No deformity.  Neurological:     Mental Status: She is alert and oriented to person, place, and time.     Coordination: Coordination normal.  Psychiatric:        Attention and Perception: Attention and perception normal. She does not perceive auditory or visual hallucinations.        Mood and Affect: Mood is anxious and depressed. Affect is flat. Affect is not labile, blunt, angry or inappropriate.        Speech: Speech normal.        Behavior: Behavior normal.        Thought Content: Thought content normal. Thought content is not paranoid or delusional. Thought content does not include homicidal or suicidal ideation. Thought content does not include homicidal or suicidal plan.        Cognition and Memory: Cognition and memory normal.        Judgment: Judgment normal.     Comments: Insight intact     Lab Review:     Component Value Date/Time   NA 140 10/14/2007 2047   K 4.0 10/14/2007 2047   CL 104 10/14/2007 2047   CO2 25 10/14/2007 2047   GLUCOSE 96 10/14/2007 2047   BUN 9 10/14/2007 2047   CREATININE 0.68 10/14/2007 2047   CALCIUM 8.8 10/14/2007 2047       Component Value Date/Time  WBC 7.3 10/14/2007 2047   RBC 3.98 10/14/2007 2047   HGB 8.3 (L) 10/14/2007 2047   HCT 30.3 (L) 10/14/2007 2047   PLT 380 10/14/2007 2047   MCV 76.1 (L) 10/14/2007 2047   MCHC 27.4 (L)  10/14/2007 2047   RDW 21.9 (H) 10/14/2007 2047   LYMPHSABS 2.0 10/25/2006 1625   MONOABS 0.5 10/25/2006 1625   EOSABS 0.1 10/25/2006 1625   BASOSABS 0.0 10/25/2006 1625    No results found for: POCLITH, LITHIUM   No results found for: PHENYTOIN, PHENOBARB, VALPROATE, CBMZ   .res Assessment: Plan:    Plan:  Xanax  1mg  TID   Therapist Augustin Mose  RTC 6 months  25 minutes spent dedicated to the care of this patient on the date of this encounter to include pre-visit review of records, ordering of medication, post visit documentation, and face-to-face time with the patient discussing depression, anxiety, insomnia, obsessional thoughts and acts and panic attacks. Discussed continuing current medication regimen.  Patient disabled and unable to work at this time with mental health and physical disabilities.  Discussed potential benefits, risk, and side effects of benzodiazepines to include potential risk of tolerance and dependence, as well as possible drowsiness. Advised patient not to drive if experiencing drowsiness and to take lowest possible effective dose to minimize risk of dependence and tolerance.  Diagnoses and all orders for this visit:  Major depressive disorder, recurrent episode, moderate (HCC)  Generalized anxiety disorder -     ALPRAZolam  (XANAX ) 1 MG tablet; TAKE 1 TABLET BY MOUTH THREE TIMES A DAY AS NEEDED FOR ANXIETY  Panic attacks -     ALPRAZolam  (XANAX ) 1 MG tablet; TAKE 1 TABLET BY MOUTH THREE TIMES A DAY AS NEEDED FOR ANXIETY  Insomnia, unspecified type  Mixed obsessional thoughts and acts     Please see After Visit Summary for patient specific instructions.  No future appointments.   No orders of the defined types were placed in this encounter.     -------------------------------

## 2024-06-05 DIAGNOSIS — J019 Acute sinusitis, unspecified: Secondary | ICD-10-CM | POA: Diagnosis not present

## 2024-06-05 DIAGNOSIS — Z6841 Body Mass Index (BMI) 40.0 and over, adult: Secondary | ICD-10-CM | POA: Diagnosis not present

## 2024-06-05 DIAGNOSIS — R059 Cough, unspecified: Secondary | ICD-10-CM | POA: Diagnosis not present

## 2024-06-14 DIAGNOSIS — I4891 Unspecified atrial fibrillation: Secondary | ICD-10-CM | POA: Diagnosis not present

## 2024-06-14 DIAGNOSIS — B029 Zoster without complications: Secondary | ICD-10-CM | POA: Diagnosis not present

## 2024-06-14 DIAGNOSIS — R0602 Shortness of breath: Secondary | ICD-10-CM | POA: Diagnosis not present

## 2024-06-15 DIAGNOSIS — B029 Zoster without complications: Secondary | ICD-10-CM | POA: Diagnosis not present

## 2024-06-15 DIAGNOSIS — R21 Rash and other nonspecific skin eruption: Secondary | ICD-10-CM | POA: Diagnosis not present

## 2024-06-16 DIAGNOSIS — B029 Zoster without complications: Secondary | ICD-10-CM | POA: Diagnosis not present

## 2024-06-16 DIAGNOSIS — Z6841 Body Mass Index (BMI) 40.0 and over, adult: Secondary | ICD-10-CM | POA: Diagnosis not present

## 2024-08-13 ENCOUNTER — Telehealth: Admitting: Adult Health

## 2024-09-26 ENCOUNTER — Encounter: Payer: Self-pay | Admitting: Gastroenterology

## 2024-09-30 ENCOUNTER — Other Ambulatory Visit: Payer: Self-pay | Admitting: Adult Health

## 2024-09-30 DIAGNOSIS — F411 Generalized anxiety disorder: Secondary | ICD-10-CM

## 2024-09-30 DIAGNOSIS — F410 Panic disorder [episodic paroxysmal anxiety] without agoraphobia: Secondary | ICD-10-CM

## 2024-10-23 ENCOUNTER — Ambulatory Visit: Admitting: Gastroenterology

## 2024-11-17 ENCOUNTER — Ambulatory Visit: Admitting: Adult Health
